# Patient Record
Sex: Male | Born: 1950 | Race: White | Hispanic: No | Marital: Married | Smoking: Former smoker
Health system: Southern US, Community
[De-identification: ages and names within clinical notes are randomized; demographics above are authoritative.]

## PROBLEM LIST (undated history)

## (undated) DIAGNOSIS — G8929 Other chronic pain: Secondary | ICD-10-CM

## (undated) DIAGNOSIS — G4730 Sleep apnea, unspecified: Secondary | ICD-10-CM

## (undated) DIAGNOSIS — G479 Sleep disorder, unspecified: Secondary | ICD-10-CM

## (undated) DIAGNOSIS — T4145XA Adverse effect of unspecified anesthetic, initial encounter: Secondary | ICD-10-CM

## (undated) DIAGNOSIS — R251 Tremor, unspecified: Secondary | ICD-10-CM

## (undated) DIAGNOSIS — K449 Diaphragmatic hernia without obstruction or gangrene: Secondary | ICD-10-CM

## (undated) DIAGNOSIS — H579 Unspecified disorder of eye and adnexa: Secondary | ICD-10-CM

## (undated) DIAGNOSIS — T8859XA Other complications of anesthesia, initial encounter: Secondary | ICD-10-CM

## (undated) DIAGNOSIS — K921 Melena: Secondary | ICD-10-CM

## (undated) DIAGNOSIS — J309 Allergic rhinitis, unspecified: Secondary | ICD-10-CM

## (undated) DIAGNOSIS — E785 Hyperlipidemia, unspecified: Secondary | ICD-10-CM

## (undated) DIAGNOSIS — K219 Gastro-esophageal reflux disease without esophagitis: Secondary | ICD-10-CM

## (undated) DIAGNOSIS — R109 Unspecified abdominal pain: Secondary | ICD-10-CM

## (undated) DIAGNOSIS — C801 Malignant (primary) neoplasm, unspecified: Secondary | ICD-10-CM

## (undated) DIAGNOSIS — R1013 Epigastric pain: Secondary | ICD-10-CM

## (undated) DIAGNOSIS — IMO0002 Reserved for concepts with insufficient information to code with codable children: Secondary | ICD-10-CM

## (undated) DIAGNOSIS — K439 Ventral hernia without obstruction or gangrene: Secondary | ICD-10-CM

## (undated) DIAGNOSIS — R0609 Other forms of dyspnea: Secondary | ICD-10-CM

## (undated) DIAGNOSIS — K5790 Diverticulosis of intestine, part unspecified, without perforation or abscess without bleeding: Secondary | ICD-10-CM

## (undated) DIAGNOSIS — M542 Cervicalgia: Secondary | ICD-10-CM

## (undated) DIAGNOSIS — F5104 Psychophysiologic insomnia: Secondary | ICD-10-CM

## (undated) DIAGNOSIS — J302 Other seasonal allergic rhinitis: Secondary | ICD-10-CM

## (undated) DIAGNOSIS — K30 Functional dyspepsia: Secondary | ICD-10-CM

## (undated) DIAGNOSIS — R197 Diarrhea, unspecified: Secondary | ICD-10-CM

## (undated) DIAGNOSIS — M549 Dorsalgia, unspecified: Secondary | ICD-10-CM

## (undated) DIAGNOSIS — R6889 Other general symptoms and signs: Secondary | ICD-10-CM

## (undated) DIAGNOSIS — R0602 Shortness of breath: Secondary | ICD-10-CM

## (undated) DIAGNOSIS — R0600 Dyspnea, unspecified: Secondary | ICD-10-CM

## (undated) DIAGNOSIS — R42 Dizziness and giddiness: Secondary | ICD-10-CM

## (undated) DIAGNOSIS — M2550 Pain in unspecified joint: Secondary | ICD-10-CM

## (undated) DIAGNOSIS — R030 Elevated blood-pressure reading, without diagnosis of hypertension: Secondary | ICD-10-CM

## (undated) DIAGNOSIS — R0789 Other chest pain: Secondary | ICD-10-CM

## (undated) DIAGNOSIS — K319 Disease of stomach and duodenum, unspecified: Secondary | ICD-10-CM

## (undated) DIAGNOSIS — G5603 Carpal tunnel syndrome, bilateral upper limbs: Secondary | ICD-10-CM

## (undated) DIAGNOSIS — H9190 Unspecified hearing loss, unspecified ear: Secondary | ICD-10-CM

## (undated) DIAGNOSIS — I10 Essential (primary) hypertension: Secondary | ICD-10-CM

## (undated) DIAGNOSIS — Z87442 Personal history of urinary calculi: Secondary | ICD-10-CM

## (undated) DIAGNOSIS — K5792 Diverticulitis of intestine, part unspecified, without perforation or abscess without bleeding: Secondary | ICD-10-CM

## (undated) HISTORY — DX: Diverticulosis of intestine, part unspecified, without perforation or abscess without bleeding: K57.90

## (undated) HISTORY — DX: Unspecified abdominal pain: R10.9

## (undated) HISTORY — DX: Carpal tunnel syndrome, bilateral upper limbs: G56.03

## (undated) HISTORY — DX: Functional dyspepsia: K30

## (undated) HISTORY — DX: Melena: K92.1

## (undated) HISTORY — DX: Other chest pain: R07.89

## (undated) HISTORY — DX: Diarrhea, unspecified: R19.7

## (undated) HISTORY — DX: Dizziness and giddiness: R42

## (undated) HISTORY — DX: Other chronic pain: G89.29

## (undated) HISTORY — PX: APPENDECTOMY: SHX54

## (undated) HISTORY — DX: Sleep apnea, unspecified: G47.30

## (undated) HISTORY — DX: Hyperlipidemia, unspecified: E78.5

## (undated) HISTORY — DX: Diaphragmatic hernia without obstruction or gangrene: K44.9

## (undated) HISTORY — DX: Dyspnea, unspecified: R06.00

## (undated) HISTORY — PX: EYE SURGERY: SHX253

## (undated) HISTORY — DX: Epigastric pain: R10.13

## (undated) HISTORY — DX: Gastro-esophageal reflux disease without esophagitis: K21.9

## (undated) HISTORY — DX: Disease of stomach and duodenum, unspecified: K31.9

## (undated) HISTORY — DX: Tremor, unspecified: R25.1

## (undated) HISTORY — PX: UPPER GI ENDOSCOPY: SHX6162

## (undated) HISTORY — DX: Allergic rhinitis, unspecified: J30.9

## (undated) HISTORY — PX: NASAL SEPTUM SURGERY: SHX37

## (undated) HISTORY — DX: Elevated blood-pressure reading, without diagnosis of hypertension: R03.0

## (undated) HISTORY — DX: Psychophysiologic insomnia: F51.04

## (undated) HISTORY — DX: Shortness of breath: R06.02

## (undated) HISTORY — DX: Dorsalgia, unspecified: M54.9

## (undated) HISTORY — DX: Other general symptoms and signs: R68.89

## (undated) HISTORY — DX: Essential (primary) hypertension: I10

## (undated) HISTORY — PX: COLONOSCOPY: SHX174

## (undated) HISTORY — DX: Sleep disorder, unspecified: G47.9

## (undated) HISTORY — DX: Pain in unspecified joint: M25.50

## (undated) HISTORY — DX: Other forms of dyspnea: R06.09

## (undated) HISTORY — DX: Cervicalgia: M54.2

---

## 1999-07-24 ENCOUNTER — Ambulatory Visit (HOSPITAL_COMMUNITY): Admission: RE | Admit: 1999-07-24 | Discharge: 1999-07-24 | Payer: Self-pay | Admitting: Pulmonary Disease

## 1999-08-16 ENCOUNTER — Encounter: Payer: Self-pay | Admitting: Pulmonary Disease

## 1999-08-16 ENCOUNTER — Encounter: Admission: RE | Admit: 1999-08-16 | Discharge: 1999-08-16 | Payer: Self-pay | Admitting: Pulmonary Disease

## 2000-10-02 ENCOUNTER — Ambulatory Visit (HOSPITAL_COMMUNITY): Admission: RE | Admit: 2000-10-02 | Discharge: 2000-10-02 | Payer: Self-pay | Admitting: *Deleted

## 2000-10-02 ENCOUNTER — Encounter: Payer: Self-pay | Admitting: *Deleted

## 2000-10-07 ENCOUNTER — Encounter: Payer: Self-pay | Admitting: *Deleted

## 2000-10-07 ENCOUNTER — Ambulatory Visit (HOSPITAL_COMMUNITY): Admission: RE | Admit: 2000-10-07 | Discharge: 2000-10-07 | Payer: Self-pay | Admitting: *Deleted

## 2001-11-24 ENCOUNTER — Encounter: Payer: Self-pay | Admitting: Family Medicine

## 2001-11-24 ENCOUNTER — Encounter: Admission: RE | Admit: 2001-11-24 | Discharge: 2001-11-24 | Payer: Self-pay | Admitting: Family Medicine

## 2004-08-26 ENCOUNTER — Encounter: Admission: RE | Admit: 2004-08-26 | Discharge: 2004-08-26 | Payer: Self-pay | Admitting: Family Medicine

## 2005-10-14 ENCOUNTER — Ambulatory Visit: Payer: Self-pay | Admitting: Gastroenterology

## 2005-10-15 ENCOUNTER — Encounter: Payer: Self-pay | Admitting: Gastroenterology

## 2005-10-15 ENCOUNTER — Ambulatory Visit: Payer: Self-pay | Admitting: Gastroenterology

## 2005-10-20 ENCOUNTER — Ambulatory Visit: Payer: Self-pay | Admitting: Gastroenterology

## 2005-11-04 ENCOUNTER — Ambulatory Visit: Payer: Self-pay | Admitting: Gastroenterology

## 2005-12-16 ENCOUNTER — Ambulatory Visit: Payer: Self-pay | Admitting: Gastroenterology

## 2005-12-18 ENCOUNTER — Ambulatory Visit (HOSPITAL_COMMUNITY): Admission: RE | Admit: 2005-12-18 | Discharge: 2005-12-18 | Payer: Self-pay | Admitting: Gastroenterology

## 2006-01-13 ENCOUNTER — Ambulatory Visit: Payer: Self-pay | Admitting: Gastroenterology

## 2006-02-17 ENCOUNTER — Ambulatory Visit: Payer: Self-pay | Admitting: Gastroenterology

## 2006-03-25 ENCOUNTER — Ambulatory Visit (HOSPITAL_BASED_OUTPATIENT_CLINIC_OR_DEPARTMENT_OTHER): Admission: RE | Admit: 2006-03-25 | Discharge: 2006-03-25 | Payer: Self-pay | Admitting: Urology

## 2006-03-25 ENCOUNTER — Encounter (INDEPENDENT_AMBULATORY_CARE_PROVIDER_SITE_OTHER): Payer: Self-pay | Admitting: Specialist

## 2007-02-09 ENCOUNTER — Ambulatory Visit: Payer: Self-pay | Admitting: Gastroenterology

## 2007-02-10 ENCOUNTER — Ambulatory Visit: Payer: Self-pay | Admitting: Gastroenterology

## 2007-03-08 ENCOUNTER — Ambulatory Visit: Payer: Self-pay | Admitting: Internal Medicine

## 2007-03-08 ENCOUNTER — Encounter: Payer: Self-pay | Admitting: Gastroenterology

## 2007-04-06 ENCOUNTER — Ambulatory Visit: Payer: Self-pay | Admitting: Gastroenterology

## 2007-05-10 DIAGNOSIS — J309 Allergic rhinitis, unspecified: Secondary | ICD-10-CM | POA: Insufficient documentation

## 2007-05-10 DIAGNOSIS — K449 Diaphragmatic hernia without obstruction or gangrene: Secondary | ICD-10-CM | POA: Insufficient documentation

## 2007-05-10 DIAGNOSIS — Z87442 Personal history of urinary calculi: Secondary | ICD-10-CM | POA: Insufficient documentation

## 2007-05-10 DIAGNOSIS — IMO0002 Reserved for concepts with insufficient information to code with codable children: Secondary | ICD-10-CM | POA: Insufficient documentation

## 2007-05-10 DIAGNOSIS — Z8711 Personal history of peptic ulcer disease: Secondary | ICD-10-CM | POA: Insufficient documentation

## 2007-05-10 DIAGNOSIS — K219 Gastro-esophageal reflux disease without esophagitis: Secondary | ICD-10-CM | POA: Insufficient documentation

## 2007-05-10 DIAGNOSIS — G4730 Sleep apnea, unspecified: Secondary | ICD-10-CM | POA: Insufficient documentation

## 2007-05-10 DIAGNOSIS — Z87448 Personal history of other diseases of urinary system: Secondary | ICD-10-CM | POA: Insufficient documentation

## 2008-02-08 ENCOUNTER — Encounter: Admission: RE | Admit: 2008-02-08 | Discharge: 2008-02-08 | Payer: Self-pay | Admitting: Neurology

## 2009-10-11 ENCOUNTER — Ambulatory Visit: Payer: Self-pay | Admitting: Diagnostic Radiology

## 2009-10-11 ENCOUNTER — Emergency Department (HOSPITAL_BASED_OUTPATIENT_CLINIC_OR_DEPARTMENT_OTHER): Admission: EM | Admit: 2009-10-11 | Discharge: 2009-10-11 | Payer: Self-pay | Admitting: Emergency Medicine

## 2010-04-14 ENCOUNTER — Encounter: Payer: Self-pay | Admitting: Family Medicine

## 2010-04-14 ENCOUNTER — Encounter: Payer: Self-pay | Admitting: Otolaryngology

## 2010-04-24 ENCOUNTER — Emergency Department (HOSPITAL_COMMUNITY)
Admission: EM | Admit: 2010-04-24 | Discharge: 2010-04-24 | Disposition: A | Discharge status: Home / Self Care | Payer: 59 | Attending: Emergency Medicine | Admitting: Emergency Medicine

## 2010-04-24 ENCOUNTER — Emergency Department (HOSPITAL_BASED_OUTPATIENT_CLINIC_OR_DEPARTMENT_OTHER): Payer: 59 | Attending: Emergency Medicine

## 2010-04-24 ENCOUNTER — Emergency Department (HOSPITAL_COMMUNITY): Payer: 59

## 2010-04-24 ENCOUNTER — Emergency Department (HOSPITAL_COMMUNITY): Payer: Self-pay

## 2010-04-24 DIAGNOSIS — R0989 Other specified symptoms and signs involving the circulatory and respiratory systems: Secondary | ICD-10-CM | POA: Insufficient documentation

## 2010-04-24 DIAGNOSIS — Z79899 Other long term (current) drug therapy: Secondary | ICD-10-CM | POA: Insufficient documentation

## 2010-04-24 DIAGNOSIS — IMO0002 Reserved for concepts with insufficient information to code with codable children: Secondary | ICD-10-CM | POA: Insufficient documentation

## 2010-04-24 DIAGNOSIS — K219 Gastro-esophageal reflux disease without esophagitis: Secondary | ICD-10-CM | POA: Insufficient documentation

## 2010-04-24 DIAGNOSIS — R059 Cough, unspecified: Secondary | ICD-10-CM

## 2010-04-24 DIAGNOSIS — M129 Arthropathy, unspecified: Secondary | ICD-10-CM | POA: Insufficient documentation

## 2010-04-24 DIAGNOSIS — G8929 Other chronic pain: Secondary | ICD-10-CM | POA: Insufficient documentation

## 2010-04-24 DIAGNOSIS — M549 Dorsalgia, unspecified: Secondary | ICD-10-CM | POA: Insufficient documentation

## 2010-04-24 DIAGNOSIS — J40 Bronchitis, not specified as acute or chronic: Secondary | ICD-10-CM | POA: Insufficient documentation

## 2010-04-24 DIAGNOSIS — J329 Chronic sinusitis, unspecified: Secondary | ICD-10-CM | POA: Insufficient documentation

## 2010-04-24 DIAGNOSIS — R05 Cough: Secondary | ICD-10-CM | POA: Insufficient documentation

## 2010-06-08 LAB — CBC
MCH: 28.8 pg (ref 26.0–34.0)
Platelets: 214 10*3/uL (ref 150–400)
RBC: 5.04 MIL/uL (ref 4.22–5.81)
WBC: 6.6 10*3/uL (ref 4.0–10.5)

## 2010-06-08 LAB — BASIC METABOLIC PANEL
Calcium: 9.2 mg/dL (ref 8.4–10.5)
Creatinine, Ser: 0.9 mg/dL (ref 0.4–1.5)
GFR calc Af Amer: >60 mL/min (ref >60)
GFR calc non Af Amer: >60 mL/min (ref >60)

## 2010-06-08 LAB — POCT CARDIAC MARKERS
CKMB, poc: 2.2 ng/mL (ref 1.0–8.0)
CKMB, poc: 2.3 ng/mL (ref 1.0–8.0)
Myoglobin, poc: 45.6 ng/mL (ref 12–200)
Myoglobin, poc: 70.1 ng/mL (ref 12–200)

## 2010-06-08 LAB — DIFFERENTIAL
Eosinophils Absolute: 0.1 10*3/uL (ref 0.0–0.7)
Lymphocytes Relative: 38 % (ref 12–46)
Lymphs Abs: 2.5 10*3/uL (ref 0.7–4.0)
Neutrophils Relative %: 55 % (ref 43–77)

## 2010-06-08 LAB — D-DIMER, QUANTITATIVE: D-Dimer, Quant: <0.22 ug/mL-FEU (ref 0.00–0.48)

## 2010-08-06 NOTE — Assessment & Plan Note (Signed)
[facility]                         GASTROENTEROLOGY OFFICE NOTE   NAME:Thielman, DELORES THELEN                      MRN:          [id number]  DATE:03/08/2007                            DOB:          Jul 20, 1950    Small bowel capsule procedure.   REFERRING PHYSICIAN:  Dr. Sheryn Bison.   INDICATIONS:  Melena. He has also had some epigastric pain.   Please see the printed report with photos for full details.   This is a completed study, some obscuring of small bowel mucosa by  bilious fluid. There were no abnormalities seen.   PLAN:  Per Dr. Jarold Motto. I did not think that there were any lesions  that would occupy greater than a third of lumen i.e. nothing like this  would have been missed.     Iva Boop, MD,FACG  Electronically Signed    CEG/MedQ  DD: 03/20/2007  DT: 03/21/2007  Job #: [id number]

## 2010-08-06 NOTE — Assessment & Plan Note (Signed)
[facility]                         GASTROENTEROLOGY OFFICE NOTE   NAME:[name], [name]                      MRN:          [id number]  DATE:04/06/2007                            DOB:          [birth year]    [name] is doing well and denies any further melena or hematochezia.  He continues with left lower quadrant intermittent pain, alternating  diarrhea and constipation consistent with irritable bowel syndrome, has  known diverticulosis coli.  We did enteral capsule study on March 08, 2007 to complete his workup.  This was interpreted by Dr. Leone Payor and  was entirely normal.   He weighs 216 pounds and blood pressure is 138/90 and pulse was 88 and  regular.  ABDOMINAL:  Entirely unremarkable except for some tenderness over the  sigmoid colon without any masses palpated.  Bowel sounds were normal.   ASSESSMENT:  1. Symptomatic diverticulosis.  2. History of chronic gastroesophageal reflux disease with added      proton pump inhibitor therapy controlling symptoms.  3. Vague history of recurrent melena of unexplained etiology.   RECOMMENDATIONS:  1. Continue high fiber diet with daily Citrucel.  2. Continue reflux regimen and PPI therapy.  3. Trial of sublingual Levsin every 6-8 hours.     Vania Rea. Jarold Motto, MD, Caleen Essex, FAGA  Electronically Signed    DRP/MedQ  DD: 04/06/2007  DT: 04/06/2007  Job #: [id number]

## 2010-08-06 NOTE — Assessment & Plan Note (Signed)
[facility]                         GASTROENTEROLOGY OFFICE NOTE   NAME:[name], LATREL SZYMCZAK                      MRN:          [id number]  DATE:02/09/2007                            DOB:          07/27/1950    Mr. Mans now complains of some vague epigastric discomfort, with a  history of melanotic stools several weeks ago.  He apparently saw his  primary care physician, who told him that his hemoglobin was 11 and  suggested a gastroenterology followup.  The patient has chronic left  lower quadrant pain and constipation, with a thorough extensive GI  workup.  His last colonoscopy in July 2007 did show extensive colon  diverticulosis and no evidence of diverticulitis.   The patient is on multiple medications, listed and reviewed in his  chart.  Does include daily Prevacid.  He denies abuse of aspirin or  NSAIDs.   Previous endoscopic examination was last performed in July 2007, and was  fairly unremarkable; with a negative CLO biopsy.   He is a healthy-appearing white male in no distress.  He weighs 218  pounds; blood pressure 142/90, pulse 64 and regular.  I could not  appreciate stigmata of chronic liver disease.  His chest was clear and  he had a regular rhythm without murmur, gallop or rub.  There was  hepatosplenomegaly, abdominal masses or tenderness.  Bowel sounds were  normal.  Inspection of the rectum was unremarkable, as was rectal  examination.  There was normal colored stool that was guaiac negative.   ASSESSMENT:  This is somewhat confusing picture in view of his previous  negative endoscopy.  He certainly sounds like he has had an upper GI  source of his bleeding, and we will proceed with endoscopic examination  and alter his therapy depending on what results we uncover.  I see no  evidence of active bleeding at this time.  Of note, the patient also had  high density air contrast barium enema because of his left lower  quadrant pain.   This was done in September 2007 and was normal, except  for diverticulosis.     Vania Rea. Jarold Motto, MD, Caleen Essex, FAGA  Electronically Signed    DRP/MedQ  DD: 02/09/2007  DT: 02/09/2007  Job #: [id number]

## 2010-08-09 NOTE — Assessment & Plan Note (Signed)
[facility]                           GASTROENTEROLOGY OFFICE NOTE   NAME:[name], [name]                      MRN:          [id number]  DATE:01/13/2006                            DOB:          24-Aug-1950    [name] is being evaluated by Dr. Sherron Monday for prostatitis and is  currently on Cipro twice a day for a month.  He continues with constant left  lower quadrant discomfort with the mild diarrhea but no fever or chills.  His blood work the last time was normal including CBC, and I sent him for a  air contrast barium enema that was normal except for some diverticulosis.  His previous colonoscopy on 10/15/2005 likewise was normal, including  intubation of his terminal ileum and ileal biopsies.   IBD serology has been negative.  Both ileal and small-bowel biopsies have  been normal.   He previously had blood in his stool, but rectal exam today is unremarkable  and stool is guaiac-negative.  There is tenderness over his left lower  quadrant area, however,  Vital signs were all normal and he has gained 5  pounds in weight.   ASSESSMENT:  [name] appeared to have symptomatic diverticulosis.  He  previously had good response in terms of his multiple symptoms to Zyfaxin  (nonabsorbable antibiotic) therapy which was done empirically.  We will see  how he does at this time on Cipro for the next month.   RECOMMENDATIONS:  1. High fiber diet and daily Benefiber.  2. Trial of Pamine forte 5 mg after breakfast and twice a day as      tolerated.  3. GI followup in 1 months time.  4. Continue other medications as per Dr. Foy Guadalajara.       Vania Rea. Jarold Motto, MD, Clementeen Graham, [place]      DRP/MedQ  DD:  01/13/2006  DT:  01/14/2006  Job #:  [id number]   cc:   Elana Alm. Nicholos Johns, M.D.  Martina Sinner, MD

## 2010-08-09 NOTE — Assessment & Plan Note (Signed)
[facility]                           GASTROENTEROLOGY OFFICE NOTE   NAME:[name], [name]                      MRN:          [id number]  DATE:02/17/2006                            DOB:          Sep 08, 1950    [name] continues with vague discomfort in his suprapubic area, perhaps  related to his prostate.  He apparently is going to see Dr. Laverle Patter, in  urology, for prostate biopsy because of a rise in PSA level.  He just  completed a month of Cipro for suspected chronic prostatitis without  improvement in his lower abdominal pain.   He continues to have diverticulosis problems of mostly chronic constipation  with gas and bloating.  He had no response whatsoever to  Robinul Forte trials.  He denies nausea, vomiting, or upper GI complaints.  His last colonoscopy was negative otherwise on October 15, 2005.   PHYSICAL EXAMINATION:  VITAL SIGNS:  He weighs 209 pounds which is his  normal weight, and blood pressure is 140/94.  Pulse was 80 and regular.  ABDOMEN:  Entirely benign without organomegaly, masses, or significant  tenderness.   ASSESSMENT/RECOMMENDATIONS:  [name] has symptomatic diverticulosis with  primary constipation.  I have decided to treat him with Amitiza 24 mcg twice  a day with meals and see how he responds.  I think most of his lower  abdominal pain is related to his prostate problems.  I will ask Dr. Laverle Patter  to send [place] a copy of his evaluation.     Vania Rea. Jarold Motto, MD, Caleen Essex, FAGA  Electronically Signed    DRP/MedQ  DD: 02/17/2006  DT: 02/17/2006  Job #: [id number]

## 2010-08-09 NOTE — Assessment & Plan Note (Signed)
 [facility]                           GASTROENTEROLOGY OFFICE NOTE   NAME:Dubray, VONDELL BABERS                      MRN:          [id number]  DATE:12/16/2005                            DOB:          Nov 28, 1950    Mr. Goens had some maroon stool last week and lower abdominal discomfort.  This seems to have resolved at this time.  His vital signs are all stable,  but he does have tenderness in the left lower quadrant area to deep  palpation without a definite mass.  Stool was normal color, but was trace to  +1 guaiac positive.   He had a negative colonoscopy this past July 25, also negative endoscopy at  that time.  I am concerned about his symptomatology and I wonder if he does  not have perhaps resolving ischemic colitis versus a missed colon lesion.   RECOMMENDATIONS:  1. Repeat CBC and basic laboratory parameters.  2. Outpatient air contrast barium enema.  3. Continue other medications.  Just review his chart.                                   Vania Rea. Jarold Motto, MD, Clementeen Graham, [place]   DRP/MedQ  DD:  12/16/2005  DT:  12/18/2005  Job #:  [id number]

## 2010-08-09 NOTE — Procedures (Signed)
[facility]                                 ULTRASOUND STUDY   NAME:[name], [name]                      MRN:          [id number]  DATE:10/20/2005                            DOB:          [birth year]    ACCESSION NUMBER:  [id number]   READING PHYSICIAN:  Vania Rea. Jarold Motto, MD, Clementeen Graham, FACP   PROCEDURE:  Multiplanar abdominal ultrasound imaging was performed in the  upright, supine, right and left lateral decubitus positions.   RESULTS:  Abdominal aorta normal, 2.2 cm.  The IVC is patent.   The pancreas appears normal throughout the head, body and tail without  evidence of ductal dilatation, pancreatic masses, or peripancreatic  inflammation.   Gallbladder is normal, well distended, thin walled, with no pericholecystic  fluid or intraluminal echogenic foci to suggest gallstone disease.   The common bile duct is normal and measures 4.5 mm in maximal diameter  without evidence of intraluminal foci.   The liver appears normal without evidence of parenchymal lesion, ductal  dilatation or vascular abnormality.   Kidneys are normal in appearance, right 10.8, left 10.7 cm.   Spleen is normal in size, measuring 10.1 cm without parenchymal lesion.   ASSESSMENT:  This was a normal upper abdominal ultrasound of good quality.  There was no evidence of cholelithiasis or biliary ductal dilatation.  The  pancreas, liver, and kidneys are well-imaged and appear normal.                                   Vania Rea. Jarold Motto, MD, Clementeen Graham, [place]   DRP/MedQ  DD:  10/21/2005  DT:  10/21/2005  Job #:  [id number]

## 2010-08-09 NOTE — Assessment & Plan Note (Signed)
[facility]                           GASTROENTEROLOGY OFFICE NOTE   NAME:[name], [name]                      MRN:          [id number]  DATE:  10/14/2005                              DOB:      Apr 15, 1950    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 Foy Guzman for evaluation of recurrent burning substernal pain  radiating through his throat, associated mouth sores, hoarseness, and  associated dyspepsia, indigestion, and a burning sensation throughout his  entire GI tract into his rectum. He apparently has had recurrent mouth sores  and has been treated with antibiotics and antifungal medications without  improvement. He has chronic constipation and does use laxatives but has had  no rectal bleeding.  He has had associated anorexia and a 30 pound weight  loss over the last year.   I previously saw this patient and he had peptic ulcer disease at the time of  endoscopy in 1989. H. pylori exam at that time was unremarkable. He has been  on some type of proton pump inhibitor ever since, most recently Prevacid 30  mg a day.  He has had a low-grade fever but denies night sweats, skin  rashes, but does have arthralgias in multiple joints including his knees. He  has no specific food intolerances. He has been seen by Dr. Foy Guzman and is  referred for further evaluation at this time with possible endoscopy.  It is  Dr. Pablo Guzman opinion that he probably has irritable bowel syndrome.   PAST MEDICAL HISTORY:  Otherwise is remarkable for degenerative arthritis  and degenerative disk disease in his back and neck. He passed a kidney stone  some 15 years ago.  He carries a diagnosis of sleep apnea, but is not on a C-Pap machine. He  had an appendectomy 40 years ago.   MEDICATIONS:  1.  Allegra 180 mg daily.  2.  Trazodone 150 mg h.s.  3.  Celebrex 200 mg daily.  4.  Ambien 12.5 mg h.s.  5.  Neurontin 400 mg t.i.d.  6.   Cyclobenzaprine 10 mg h.s.  7.  Prevacid 30 mg daily.   ALLERGIES:  He denies drug allergies.   FAMILY HISTORY:  Noncontributory.   SOCIAL HISTORY:  The patient is married and lives with his wife. He has some  college education. He does not smoke and uses beer socially.   REVIEW OF SYSTEMS:  The patient complains of severe fatigue, night sweats,  back pain, diffuse pruritus, insomnia, bleeding skin rashes, joint pains,  etc. He denies any specific cardiovascular, pulmonary, genitourinary,  neurologic, psychiatric, or endocrine problems otherwise.   PHYSICAL EXAMINATION:  GENERAL:  He is a healthy appearing white male  appearing his stated age, in no distress.  VITAL SIGNS:  He is 6 feet tall and weighs 200 pounds. Blood pressure is  114/74 and pulse was 84 and regular. I could not appreciate stigmata of  chronic liver disease. There was no thyromegaly or lymphadenopathy noted.  CHEST:  Clear to percussion and auscultation, and there were no murmur,  gallops or rubs noted.  CARDIOVASCULAR:  He was in a regular rhythm.  ABDOMEN:  I could not appreciate hepatosplenomegaly, abdominal masses or  tenderness. Bowel sounds were normal.  EXTREMITIES:  Unremarkable.  NEURO:  Mental status was normal.  RECTAL:  Unremarkable rectal exam and stool was Guaiac negative.  OROPHARYNX:  Revealed no aphthous erosions or ulcerations despite his  insistence on such.   ASSESSMENT:  I am not sure exactly what is wrong with [name]. My gut  instinct is that he probably has functional bowel problems. I think however  we need to exclude underlying inflammatory bowel disease, celiac disease, or  a combination of upper and lower GI problems mostly manifested by GI  motility disturbances.  I am somewhat concerned that he has had a 30 pound  weight loss with associated anorexia, and I think he deserves full  diagnostic evaluation.   RECOMMENDATIONS:  1.  Check CBC, metabolic profile, thyroid panel, sed  rate, IBD serology      markers, and sprue antibodies.  2.  Outpatient endoscopy and colonoscopy.  3.  Consider upper abdominal ultrasound exam.  4.  Further work up once above completed.  5.  Continue other medications as per Dr. Foy Guzman until work up completed.                                   Vania Rea. Jarold Motto, MD, Clementeen Graham, [place]   DRP/MedQ  DD:  10/14/2005  DT:  10/14/2005  Job #:  [id number]   cc:   Warren Maduro L. Warren Guadalajara, MD

## 2010-08-09 NOTE — Op Note (Signed)
NAME:  [name], [name] NO.:  [id number]   MEDICAL RECORD NO.:  [id number]          PATIENT TYPE:  AMB   LOCATION:  [facility]                         FACILITY:  [facility]   PHYSICIAN:  Heloise Purpura, MD      DATE OF BIRTH:  June 21, 1950   DATE OF PROCEDURE:  03/25/2006  DATE OF DISCHARGE:                               OPERATIVE REPORT   PREOPERATIVE DIAGNOSIS:  Elevated PSA.   POSTOPERATIVE DIAGNOSIS:  Elevated PSA.   PROCEDURES:  1. Transrectal ultrasound of the prostate.  2. Prostate needle biopsy.   SURGEON:  Dr. Heloise Purpura.   ANESTHESIA:  MAC with local periprostatic block.   SPECIMENS:  Prostate biopsies from the left apex, left mid, left base,  right apex, right mid, and right base.   INDICATION:  [name] is a 60 year old gentleman with an elevated PSA  of 3.78 and a low free percentage of 6%.  He did have an increased PSA  velocity within the past year.  Despite a normal digital rectal exam,  after our discussion, we elected to proceed with a prostate biopsy.  The  patient has a very low tolerance to pain and, therefore, it was decided  to perform his procedure under anesthesia.  Potential risks and benefits  were discussed with the patient and he consented.   DESCRIPTION OF PROCEDURE:  The patient was taken to the operating room  and a MAC anesthetic was administered.  He had been prescribed pre  procedure antibiotics beginning the day before his procedure.  He also  has been administered a pre procedure enema.  He was placed in the left  lateral decubitus position and a periprostatic nerve block with 2%  lidocaine (10 mL) was performed.  The prostate was then imaged and  demonstrated a hypoechoic area in the right mid lateral portion of the  prostate.  No other abnormalities were noted.  The prostate volume was  measured at 21.5 mL.  Twelve systematic biopsies were then performed  from the prostate at the left base, left mid, left apex, right base,  right mid, and right apex of the prostate.  The patient tolerated the  procedure well and without complications.  A digital rectal exam was  performed and there was no evidence of a rectal hematoma at the end of  the procedure.  He was therefore able to be awakened and transferred to  the recovery unit in satisfactory condition.           ______________________________  Heloise Purpura, MD  Electronically Signed     LB/MEDQ  D:  03/25/2006  T:  03/25/2006  Job:  [id number]

## 2011-08-11 ENCOUNTER — Encounter (HOSPITAL_BASED_OUTPATIENT_CLINIC_OR_DEPARTMENT_OTHER): Payer: Self-pay

## 2011-08-11 ENCOUNTER — Emergency Department (HOSPITAL_BASED_OUTPATIENT_CLINIC_OR_DEPARTMENT_OTHER): Payer: 59

## 2011-08-11 ENCOUNTER — Other Ambulatory Visit: Payer: Self-pay

## 2011-08-11 ENCOUNTER — Emergency Department (HOSPITAL_BASED_OUTPATIENT_CLINIC_OR_DEPARTMENT_OTHER)
Admission: EM | Admit: 2011-08-11 | Discharge: 2011-08-11 | Disposition: A | Discharge status: Home / Self Care | Payer: 59 | Attending: Emergency Medicine | Admitting: Emergency Medicine

## 2011-08-11 DIAGNOSIS — R091 Pleurisy: Secondary | ICD-10-CM | POA: Insufficient documentation

## 2011-08-11 DIAGNOSIS — G8929 Other chronic pain: Secondary | ICD-10-CM | POA: Insufficient documentation

## 2011-08-11 DIAGNOSIS — J45909 Unspecified asthma, uncomplicated: Secondary | ICD-10-CM | POA: Insufficient documentation

## 2011-08-11 DIAGNOSIS — R1032 Left lower quadrant pain: Secondary | ICD-10-CM | POA: Insufficient documentation

## 2011-08-11 DIAGNOSIS — R05 Cough: Secondary | ICD-10-CM | POA: Insufficient documentation

## 2011-08-11 DIAGNOSIS — J209 Acute bronchitis, unspecified: Secondary | ICD-10-CM | POA: Insufficient documentation

## 2011-08-11 DIAGNOSIS — R059 Cough, unspecified: Secondary | ICD-10-CM | POA: Insufficient documentation

## 2011-08-11 DIAGNOSIS — R079 Chest pain, unspecified: Secondary | ICD-10-CM | POA: Insufficient documentation

## 2011-08-11 DIAGNOSIS — R0602 Shortness of breath: Secondary | ICD-10-CM | POA: Insufficient documentation

## 2011-08-11 HISTORY — DX: Diverticulitis of intestine, part unspecified, without perforation or abscess without bleeding: K57.92

## 2011-08-11 HISTORY — DX: Reserved for concepts with insufficient information to code with codable children: IMO0002

## 2011-08-11 LAB — BASIC METABOLIC PANEL
CO2: 27 mEq/L (ref 19–32)
Calcium: 9.3 mg/dL (ref 8.4–10.5)
Sodium: 139 mEq/L (ref 135–145)

## 2011-08-11 LAB — DIFFERENTIAL
Basophils Absolute: 0 10*3/uL (ref 0.0–0.1)
Lymphocytes Relative: 38 % (ref 12–46)
Monocytes Absolute: 0.6 10*3/uL (ref 0.1–1.0)
Neutro Abs: 4.2 10*3/uL (ref 1.7–7.7)
Neutrophils Relative %: 53 % (ref 43–77)

## 2011-08-11 LAB — D-DIMER, QUANTITATIVE: D-Dimer, Quant: 0.28 ug/mL-FEU (ref 0.00–0.48)

## 2011-08-11 LAB — HEPATIC FUNCTION PANEL
AST: 24 U/L (ref 0–37)
Bilirubin, Direct: <0.1 mg/dL (ref 0.0–0.3)

## 2011-08-11 LAB — LIPASE, BLOOD: Lipase: 64 U/L — ABNORMAL HIGH (ref 11–59)

## 2011-08-11 LAB — PRO B NATRIURETIC PEPTIDE: Pro B Natriuretic peptide (BNP): 29.3 pg/mL (ref 0–125)

## 2011-08-11 LAB — CBC
MCH: 29.8 pg (ref 26.0–34.0)
MCV: 86.3 fL (ref 78.0–100.0)
Platelets: 235 10*3/uL (ref 150–400)
RBC: 4.59 MIL/uL (ref 4.22–5.81)
RDW: 12.9 % (ref 11.5–15.5)
WBC: 7.8 10*3/uL (ref 4.0–10.5)

## 2011-08-11 LAB — TROPONIN I: Troponin I: <0.3 ng/mL (ref <0.30)

## 2011-08-11 MED ORDER — ALBUTEROL SULFATE HFA 108 (90 BASE) MCG/ACT IN AERS: 2.0000 | INHALATION_SPRAY | RESPIRATORY_TRACT | Status: DC | PRN

## 2011-08-11 MED ORDER — PREDNISONE 50 MG PO TABS
60.0000 mg | ORAL_TABLET | Freq: Once | ORAL | Status: AC
  Administered 2011-08-11: 60 mg via ORAL
  Filled 2011-08-11: qty 1

## 2011-08-11 MED ORDER — IPRATROPIUM BROMIDE 0.02 % IN SOLN
0.5000 mg | Freq: Once | RESPIRATORY_TRACT | Status: AC
  Administered 2011-08-11: 0.5 mg via RESPIRATORY_TRACT
  Filled 2011-08-11: qty 2.5

## 2011-08-11 MED ORDER — PREDNISONE 20 MG PO TABS: ORAL_TABLET | ORAL | Status: AC

## 2011-08-11 MED ORDER — ALBUTEROL SULFATE (5 MG/ML) 0.5% IN NEBU
5.0000 mg | INHALATION_SOLUTION | Freq: Once | RESPIRATORY_TRACT | Status: AC
  Administered 2011-08-11: 5 mg via RESPIRATORY_TRACT
  Filled 2011-08-11: qty 1

## 2011-08-11 NOTE — ED Provider Notes (Signed)
History     CSN: [id number]  Arrival date & time 08/11/11  1043   First MD Initiated Contact with Patient 08/11/11 1058      Chief Complaint  Patient presents with  . Chest Pain    (Consider location/radiation/quality/duration/timing/severity/associated sxs/prior treatment) HPI This 61 year old male has 2 weeks of cough with sharp stabbing pleuritic anterior well localized lower sternal chest type pain nonradiating present 24 hours a day worse with cough, deep breathing, and torso position changes. It is somewhat pleuritic but nonexertional. He is a cough with some yellowish sputum production as well. He has had no fever. He has no confusion or dizziness. He has no abdominal pain today but he has had chronic abdominal pain in the past in the left lower quadrant which is now bothering him recently. He has chronic stable neck and back pain in his low back area which are unchanged today. There is no treatment prior to arrival. He has a history of asthma in the past and has used inhalers in the past but has not used it recently. He has had mild shortness of breath. Past Medical History  Diagnosis Date  . Asthma   . Degenerative disk disease   . Diverticulitis   . Gastric ulcer     Past Surgical History  Procedure Date  . Appendectomy   . Nasal septum surgery     No family history on file.  History  Substance Use Topics  . Smoking status: Never Smoker   . Smokeless tobacco: Never Used  . Alcohol Use: 1.2 oz/week    1 Glasses of wine, 1 Cans of beer per week     daily      Review of Systems  Constitutional: Negative for fever.       10 Systems reviewed and are negative for acute change except as noted in the HPI.  HENT: Negative for congestion.   Eyes: Negative for discharge and redness.  Respiratory: Positive for cough and shortness of breath.   Cardiovascular: Positive for chest pain.  Gastrointestinal: Negative for vomiting and abdominal pain.  Musculoskeletal:  Negative for back pain.  Skin: Negative for rash.  Neurological: Negative for syncope, numbness and headaches.  Psychiatric/Behavioral:       No behavior change.    Allergies  Review of patient's allergies indicates no known allergies.  Home Medications   Current Outpatient Rx  Name Route Sig Dispense Refill  . CELECOXIB 200 MG PO CAPS Oral Take 200 mg by mouth daily.    Marland Kitchen VITAMIN D 1000 UNITS PO TABS Oral Take 1,000 Units by mouth daily.    . CYCLOBENZAPRINE HCL 10 MG PO TABS Oral Take 10 mg by mouth 3 (three) times daily as needed. For back pain    . OMEGA-3 FATTY ACIDS 1000 MG PO CAPS Oral Take 2 g by mouth daily.    Marland Kitchen GABAPENTIN 400 MG PO CAPS Oral Take 800 mg by mouth 2 (two) times daily.    Marland Kitchen GLUCOSAMINE-CHONDROITIN 500-400 MG PO TABS Oral Take 1 tablet by mouth daily.    Marland Kitchen HYDROCODONE-ACETAMINOPHEN 5-500 MG PO TABS Oral Take 1 tablet by mouth every 8 (eight) hours as needed. For back pain    . LORATADINE 10 MG PO TABS Oral Take 10 mg by mouth daily.    . MULTIVITAMINS PO TABS Oral Take 1 tablet by mouth daily.    Marland Kitchen PANTOPRAZOLE SODIUM 40 MG PO TBEC Oral Take 40 mg by mouth daily.    Marland Kitchen ALIGN  4 MG PO CAPS Oral Take 1 capsule by mouth daily.     Marland Kitchen TEMAZEPAM 15 MG PO CAPS Oral Take 30 mg by mouth at bedtime as needed. For sleep    . TRAZODONE HCL 150 MG PO TABS Oral Take 150 mg by mouth at bedtime. For sleep    . ALBUTEROL SULFATE HFA 108 (90 BASE) MCG/ACT IN AERS Inhalation Inhale 2 puffs into the lungs every 2 (two) hours as needed for wheezing or shortness of breath (cough). 1 Inhaler 0  . PREDNISONE 20 MG PO TABS  2 tabs po daily x 4 days 8 tablet 0    BP 149/87  Pulse 74  Temp(Src) 98.2 F (36.8 C) (Oral)  Resp 18  SpO2 97%  Physical Exam  Nursing note and vitals reviewed. Constitutional:       Awake, alert, nontoxic appearance.  HENT:  Head: Atraumatic.  Eyes: Right eye exhibits no discharge. Left eye exhibits no discharge.  Neck: Neck supple.  Cardiovascular:  Normal rate and regular rhythm.   No murmur heard. Pulmonary/Chest: Effort normal. No respiratory distress. He has wheezes. He has no rales. He exhibits tenderness.       Reproducible lower anterior sternal tenderness  Abdominal: Soft. There is no tenderness. There is no rebound.  Musculoskeletal: He exhibits no tenderness.       Baseline ROM, no obvious new focal weakness.  Neurological:       Mental status and motor strength appears baseline for patient and situation.  Skin: No rash noted.  Psychiatric: He has a normal mood and affect.    ED Course  Procedures (including critical care time) ECG: Normal sinus rhythm, ventricular rate 67, normal axis, normal intervals, no acute ischemic changes noted, no significant change compared with July 2011, impression normal ECG Labs Reviewed  BASIC METABOLIC PANEL - Abnormal; Notable for the following:    Glucose, Bld 102 (*)    All other components within normal limits  LIPASE, BLOOD - Abnormal; Notable for the following:    Lipase 64 (*)    All other components within normal limits  CBC  PRO B NATRIURETIC PEPTIDE  TROPONIN I  D-DIMER, QUANTITATIVE  HEPATIC FUNCTION PANEL  DIFFERENTIAL   No results found.   1. Chest pain   2. Bronchitis with bronchospasm   3. Pleurisy       MDM  Pt stable in ED with no significant deterioration in condition.Patient / Family / Caregiver informed of clinical course, understand medical decision-making process, and agree with plan.I doubt any other EMC precluding discharge at this time including, but not necessarily limited to the following:PE, ACS.        Hurman Horn, MD 08/18/11 [id number]

## 2011-08-11 NOTE — ED Notes (Signed)
Pt returned from radiology.

## 2011-08-11 NOTE — Discharge Instructions (Signed)
Acute Bronchitis You have acute bronchitis. This means you have a chest cold. The airways in your lungs are red and sore (inflamed). Acute means it is sudden onset.  CAUSES Bronchitis is most often caused by the same virus that causes a cold. SYMPTOMS   Body aches.   Chest congestion.   Chills.   Cough.   Fever.   Shortness of breath.   Sore throat.  TREATMENT  Acute bronchitis is usually treated with rest, fluids, and medicines for relief of fever or cough. Most symptoms should go away after a few days or a week. Increased fluids may help thin your secretions and will prevent dehydration. Your caregiver may give you an inhaler to improve your symptoms. The inhaler reduces shortness of breath and helps control cough. You can take over-the-counter pain relievers or cough medicine to decrease coughing, pain, or fever. A cool-air vaporizer may help thin bronchial secretions and make it easier to clear your chest. Antibiotics are usually not needed but can be prescribed if you smoke, are seriously ill, have chronic lung problems, are elderly, or you are at higher risk for developing complications.Allergies and asthma can make bronchitis worse. Repeated episodes of bronchitis may cause longstanding lung problems. Avoid smoking and secondhand smoke.Exposure to cigarette smoke or irritating chemicals will make bronchitis worse. If you are a cigarette smoker, consider using nicotine gum or skin patches to help control withdrawal symptoms. Quitting smoking will help your lungs heal faster. Recovery from bronchitis is often slow, but you should start feeling better after 2 to 3 days. Cough from bronchitis frequently lasts for 3 to 4 weeks. To prevent another bout of acute bronchitis:  Quit smoking.   Wash your hands frequently to get rid of viruses or use a hand sanitizer.   Avoid other people with cold or virus symptoms.   Try not to touch your hands to your mouth, nose, or eyes.  SEEK  IMMEDIATE MEDICAL CARE IF:  You develop increased fever, chills, or chest pain.   You have severe shortness of breath or bloody sputum.   You develop dehydration, fainting, repeated vomiting, or a severe headache.   You have no improvement after 1 week of treatment or you get worse.  MAKE SURE YOU:   Understand these instructions.   Will watch your condition.   Will get help right away if you are not doing well or get worse.  Document Released: 04/17/2004 Document Revised: 02/27/2011 Document Reviewed: 07/03/2010 [facility] Patient Information 2012 [place], [place].  Your caregiver has diagnosed you as having chest pain that is not specific for one problem, but does not require admission.  You are at low risk for an acute heart condition or other serious illness. Chest pain comes from many different causes.  SEEK IMMEDIATE MEDICAL ATTENTION IF: You have severe chest pain, especially if the pain is crushing or pressure-like and spreads to the arms, back, neck, or jaw, or if you have sweating, nausea (feeling sick to your stomach), or shortness of breath. THIS IS AN EMERGENCY. Don't wait to see if the pain will go away. Get medical help at once. Call 911 or 0 (operator). DO NOT drive yourself to the hospital.  Your chest pain gets worse and does not go away with rest.  You have an attack of chest pain lasting longer than usual, despite rest and treatment with the medications your caregiver has prescribed.  You wake from sleep with chest pain or shortness of breath.  You feel dizzy or  faint.  You have chest pain not typical of your usual pain for which you originally saw your caregiver.  RETURN IMMEDIATELY IF you develop shortness of breath, confusion or altered mental status, a new rash, become dizzy, faint, or poorly responsive, or are unable to be cared for at home.

## 2011-08-11 NOTE — ED Notes (Signed)
Pt reports intermittent sharp chest pain radiating to back x 2 weeks.

## 2011-08-11 NOTE — ED Notes (Signed)
Patient transported to X-ray 

## 2011-11-14 ENCOUNTER — Other Ambulatory Visit: Payer: Self-pay | Admitting: Dermatology

## 2012-02-17 ENCOUNTER — Other Ambulatory Visit: Payer: Self-pay | Admitting: Dermatology

## 2012-05-14 ENCOUNTER — Other Ambulatory Visit (HOSPITAL_BASED_OUTPATIENT_CLINIC_OR_DEPARTMENT_OTHER): Payer: Self-pay | Admitting: Family Medicine

## 2012-05-14 DIAGNOSIS — M5412 Radiculopathy, cervical region: Secondary | ICD-10-CM

## 2012-05-15 ENCOUNTER — Ambulatory Visit (HOSPITAL_BASED_OUTPATIENT_CLINIC_OR_DEPARTMENT_OTHER)
Admission: RE | Admit: 2012-05-15 | Discharge: 2012-05-15 | Disposition: A | Discharge status: Home / Self Care | Payer: 59 | Source: Ambulatory Visit | Attending: Family Medicine | Admitting: Family Medicine

## 2012-05-15 ENCOUNTER — Other Ambulatory Visit (HOSPITAL_BASED_OUTPATIENT_CLINIC_OR_DEPARTMENT_OTHER): Payer: Self-pay | Admitting: Family Medicine

## 2012-05-15 DIAGNOSIS — M5412 Radiculopathy, cervical region: Secondary | ICD-10-CM

## 2012-05-15 DIAGNOSIS — IMO0002 Reserved for concepts with insufficient information to code with codable children: Secondary | ICD-10-CM

## 2012-05-15 DIAGNOSIS — M47812 Spondylosis without myelopathy or radiculopathy, cervical region: Secondary | ICD-10-CM | POA: Insufficient documentation

## 2012-06-17 ENCOUNTER — Encounter: Payer: Self-pay | Admitting: Neurology

## 2012-06-22 HISTORY — PX: CARPAL TUNNEL RELEASE: SHX101

## 2013-03-02 DIAGNOSIS — IMO0001 Reserved for inherently not codable concepts without codable children: Secondary | ICD-10-CM

## 2013-03-02 HISTORY — DX: Reserved for inherently not codable concepts without codable children: IMO0001

## 2013-03-14 ENCOUNTER — Encounter: Payer: Self-pay | Admitting: Cardiology

## 2013-03-16 ENCOUNTER — Encounter: Payer: Self-pay | Admitting: Cardiology

## 2013-03-16 ENCOUNTER — Ambulatory Visit (INDEPENDENT_AMBULATORY_CARE_PROVIDER_SITE_OTHER): Payer: 59 | Admitting: Cardiology

## 2013-03-16 VITALS — BP 128/80 | HR 76 | Ht 70.5 in | Wt 230.0 lb

## 2013-03-16 DIAGNOSIS — R0609 Other forms of dyspnea: Secondary | ICD-10-CM

## 2013-03-16 DIAGNOSIS — R0989 Other specified symptoms and signs involving the circulatory and respiratory systems: Secondary | ICD-10-CM

## 2013-03-16 DIAGNOSIS — K219 Gastro-esophageal reflux disease without esophagitis: Secondary | ICD-10-CM

## 2013-03-16 DIAGNOSIS — Z8249 Family history of ischemic heart disease and other diseases of the circulatory system: Secondary | ICD-10-CM

## 2013-03-16 DIAGNOSIS — R0600 Dyspnea, unspecified: Secondary | ICD-10-CM

## 2013-03-16 DIAGNOSIS — R079 Chest pain, unspecified: Secondary | ICD-10-CM

## 2013-03-16 NOTE — Progress Notes (Signed)
[address] [place], [place]  [address] Phone: [phone] Fax:  [phone]  Date:  03/16/2013   ID:  [name], DOB 1951/02/11, MRN [id number]  PCP:  No primary provider on file.   History of Present Illness: [name] is a 62 y.o. male Here for the evaluation of midsternal chest discomfort/pressure with exertional dyspnea. Has a family history of coronary artery disease. His blood pressure has been elevated for approximately 2 years and antihypertensive medications were recently started. His brother urged him to come to the cardiologist for further evaluation. He has had midsternal chest pressure which is thought was secondary to hiatal hernia or peptic ulcer disease. No radiation of symptoms. No diaphoresis. He may have had occasional dyspnea or dizziness when standing or exerting himself. SOB has worsened over the past 2 years. He is a former smoker several years ago. Previous EKG demonstrated nonspecific T-wave abnormality in the inferior leads. Creatinine 0.84, TSH 1.3, normal.  Father died at age 47 from MI. Mother died of Alzheimer's.  Used to work in [employer] but had an accident, disability secondary to back pain.  Wt Readings from Last 3 Encounters:  03/16/13 230 lb (104.327 kg)     Past Medical History  Diagnosis Date  . Asthma   . Degenerative disk disease   . Diverticulitis   . Gastric ulcer   . Elevated blood pressure 03/02/13    (x) 2 years. Referral to cardiology  . SOB (shortness of breath)   . Chronic back pain     On celebrex  . Chronic neck pain     On celebrex  . Midsternal chest pain     Has had for a while, which he contributed to hiatal hernia and peptic disease. No radiation, not sure about duration, not associated with diaphoresis.  . Dizziness     occational  . DOE (dyspnea on exertion)     occasional. Now can also feel when he is at rest. Former smoker.  . Peptic disease   . Hiatal hernia   . Chest  discomfort     Could be related to hiatal hernia but because of Hx of HTN, HLD and + FHX, an exercise stress test & referral to cardiology will be arranged.  . Esophageal reflux   . Benign essential hypertension     Low salt diet. Systolic goal <140, diastolic goal <90.  Marland Kitchen Hyperlipidemia     Last FLP in 2012 slightly elevated. LDL goal <100.  Marland Kitchen Allergic rhinitis, cause unspecified   . Cervicalgia   . Pain in joint, site unspecified   . Sleep disturbance, unspecified   . Chronic insomnia   . Abdominal pain   . Dyspepsia   . Diarrhea   . Blood in stool   . Sleep apnea syndrome   . Tremor     Per Dr. Sandria Manly  . ENT complaint     Per Dr. Ezzard Standing  . Diverticulosis   . Carpal tunnel syndrome, bilateral     Past Surgical History  Procedure Laterality Date  . Appendectomy    . Nasal septum surgery      Current Outpatient Prescriptions  Medication Sig Dispense Refill  . albuterol (PROVENTIL HFA;VENTOLIN HFA) 108 (90 BASE) MCG/ACT inhaler Inhale 2 puffs into the lungs every 2 (two) hours as needed for wheezing or shortness of breath (cough).  1 Inhaler  0  . celecoxib (CELEBREX) 200 MG capsule Take 200 mg  by mouth daily.      . cholecalciferol (VITAMIN D) 1000 UNITS tablet Take 1,000 Units by mouth daily.      . cyclobenzaprine (FLEXERIL) 10 MG tablet Take 5-10 mg by mouth 3 (three) times daily as needed. For back pain      . dexlansoprazole (DEXILANT) 60 MG capsule Take 60 mg by mouth daily.      . diclofenac sodium (VOLTAREN) 1 % GEL Apply 2 g topically 4 (four) times daily as needed.      . fish oil-omega-3 fatty acids 1000 MG capsule Take 1 g by mouth daily.       . fluticasone (VERAMYST) 27.5 MCG/SPRAY nasal spray Place 1-2 sprays into the nose daily.      Marland Kitchen gabapentin (NEURONTIN) 400 MG capsule Take 800 mg by mouth 2 (two) times daily.      Marland Kitchen HYDROcodone-acetaminophen (NORCO/VICODIN) 5-325 MG per tablet Take 1 tablet by mouth 3 (three) times daily as needed for moderate pain.        Marland Kitchen loratadine (CLARITIN) 10 MG tablet Take 10 mg by mouth as needed.       . Probiotic Product (ALIGN) 4 MG CAPS Take 1 capsule by mouth daily.       . traZODone (DESYREL) 150 MG tablet Take 150 mg by mouth at bedtime. For sleep      . zolpidem (AMBIEN) 10 MG tablet Take 10 mg by mouth at bedtime.       No current facility-administered medications for this visit.    Allergies:   No Known Allergies  Social History:  The patient  reports that he quit smoking about 21 years ago. He has never used smokeless tobacco. He reports that he drinks about 1.2 ounces of alcohol per week. He reports that he does not use illicit drugs.  Used to be in [employer], back injury.   Family History  Problem Relation Age of Onset  . CAD    . CAD Father   . CAD Brother   . Hypertension Brother   . Hyperlipidemia Brother   . Alzheimer's disease Mother   . Hyperlipidemia Sister   . Hypertension Sister     ROS:  Please see the history of present illness.   Denies any strokelike symptoms, fevers, chills, rash, syncope, bleeding, orthopnea, PND.  All other systems reviewed and negative.   PHYSICAL EXAM: VS:  BP 128/80  Pulse 76  Ht 5' 10.5" (1.791 m)  Wt 230 lb (104.327 kg)  BMI 32.52 kg/m2 Well nourished, well developed, in no acute distress HEENT: normal, [place]/AT, EOMI Neck: no JVD, normal carotid upstroke, no bruit Cardiac:  normal S1, S2; RRR; no murmur Lungs:  clear to auscultation bilaterally, no wheezing, rhonchi or rales Abd: soft, nontender, no hepatomegaly, no bruits Ext: no edema, 2+ distal pulses Skin: warm and dry GU: deferred Neuro: no focal abnormalities noted, AAO x 3  EKG:  Normal sinus rhythm, nonspecific T-wave flattening, heart rate 76  ASSESSMENT AND PLAN:  1. Chest pain-could certainly be secondary to underlying GERD however given his father's family history. EKG should allow [place] to proceed with exercise treadmill test. If symptoms become worse and are more worrisome in  the future, further cardiac testing may be warranted. Continue with aggressive primary prevention strategies including exercise, dietary modifications. 2. Dyspnea-also could be multifactorial including deconditioning. Nonetheless, I will check an echocardiogram to ensure proper structure and function of his heart. Remote tobacco history quit over 20 years ago. Could consider  PFTs if echocardiogram and cardiac workup unremarkable. 3. GERD-possible etiology for symptoms. On several medications. 4. We will followup with studies.  Signed, Donato Schultz, MD [facility]  03/16/2013 11:33 AM

## 2013-03-16 NOTE — Patient Instructions (Signed)
Your physician has requested that you have an exercise tolerance test. Please also follow instruction sheet, as given.  Your physician has requested that you have an echocardiogram. Echocardiography is a painless test that uses sound waves to create images of your heart. It provides your doctor with information about the size and shape of your heart and how well your heart's chambers and valves are working. This procedure takes approximately one hour. There are no restrictions for this procedure.  Your physician recommends that you schedule a follow-up appointment in: AFTER TESTS ARE DONE TO DISCUSS RESULTS

## 2013-03-31 ENCOUNTER — Ambulatory Visit (INDEPENDENT_AMBULATORY_CARE_PROVIDER_SITE_OTHER): Payer: 59 | Admitting: Physician Assistant

## 2013-03-31 DIAGNOSIS — R079 Chest pain, unspecified: Secondary | ICD-10-CM

## 2013-03-31 NOTE — Progress Notes (Signed)
Overall reassuring stress treadmill. If symptoms worsen or become more worrisome, please come back for office visit.

## 2013-03-31 NOTE — Progress Notes (Signed)
Exercise Treadmill Test  Pre-Exercise Testing Evaluation Rhythm: normal sinus  Rate: 65 bpm     Test  Exercise Tolerance Test Ordering MD: Candee Furbish, MD  Interpreting MD: Richardson Dopp, PA-C  Unique Test No: 1  Treadmill:  1  Indication for ETT: chest pain - rule out ischemia  Contraindication to ETT: No   Stress Modality: exercise - treadmill  Cardiac Imaging Performed: non   Protocol: standard Bruce - maximal  Max BP:  164/88  Max MPHR (bpm):  160 85% MPR (bpm):  136  MPHR obtained (bpm):  150 % MPHR obtained:  93  Reached 85% MPHR (min:sec):  6:43 Total Exercise Time (min-sec):  8:00  Workload in METS:  10.1 Borg Scale: 19  Reason ETT Terminated:  desired heart rate attained    ST Segment Analysis At Rest: normal ST segments - no evidence of significant ST depression With Exercise: non-specific ST changes  Other Information Arrhythmia:  No Angina during ETT:  absent (0) Quality of ETT:  diagnostic  ETT Interpretation:  normal - no evidence of ischemia by ST analysis  Comments: Good exercise capacity. No chest pain. Normal BP response to exercise. No significant ST changes to suggest ischemia.   Recommendations: F/u with Dr. Candee Furbish as directed. Signed, Richardson Dopp, PA-C   03/31/2013 9:36 AM

## 2013-04-05 ENCOUNTER — Encounter: Payer: Self-pay | Admitting: Cardiovascular Disease

## 2013-04-05 ENCOUNTER — Ambulatory Visit (HOSPITAL_COMMUNITY): Payer: 59 | Attending: Cardiology | Admitting: Radiology

## 2013-04-05 DIAGNOSIS — G4730 Sleep apnea, unspecified: Secondary | ICD-10-CM | POA: Insufficient documentation

## 2013-04-05 DIAGNOSIS — Z87891 Personal history of nicotine dependence: Secondary | ICD-10-CM | POA: Insufficient documentation

## 2013-04-05 DIAGNOSIS — R0602 Shortness of breath: Secondary | ICD-10-CM

## 2013-04-05 DIAGNOSIS — R0600 Dyspnea, unspecified: Secondary | ICD-10-CM

## 2013-04-05 DIAGNOSIS — Z8249 Family history of ischemic heart disease and other diseases of the circulatory system: Secondary | ICD-10-CM | POA: Insufficient documentation

## 2013-04-05 NOTE — Progress Notes (Signed)
Echocardiogram performed.  

## 2013-05-12 ENCOUNTER — Other Ambulatory Visit: Payer: Self-pay | Admitting: Orthopedic Surgery

## 2013-05-12 ENCOUNTER — Encounter (HOSPITAL_BASED_OUTPATIENT_CLINIC_OR_DEPARTMENT_OTHER): Payer: Self-pay | Admitting: *Deleted

## 2013-05-12 NOTE — Progress Notes (Signed)
Pt has had chronic pain-had cp-stress test dr Marlou Porch 1/15-normal-ekg done will try to come in for bmet Had a sleep test-was to try cpap-could not sleep-does not use

## 2013-05-12 NOTE — Progress Notes (Signed)
05/12/13 1239  OBSTRUCTIVE SLEEP APNEA  Have you ever been diagnosed with sleep apnea through a sleep study? Yes  If yes, do you have and use a CPAP or BPAP machine every night? 0  Do you snore loudly (loud enough to be heard through closed doors)?  1  Do you often feel tired, fatigued, or sleepy during the daytime? 0  Has anyone observed you stop breathing during your sleep? 1  Do you have, or are you being treated for high blood pressure? 1  BMI more than 35 kg/m2? 0  Age over 42 years old? 1  Neck circumference greater than 40 cm/18 inches? 0  Gender: 1  Obstructive Sleep Apnea Score 5  Score 4 or greater  Results sent to PCP

## 2013-05-16 ENCOUNTER — Encounter (HOSPITAL_BASED_OUTPATIENT_CLINIC_OR_DEPARTMENT_OTHER)
Admission: RE | Admit: 2013-05-16 | Discharge: 2013-05-16 | Disposition: A | Discharge status: Home / Self Care | Payer: 59 | Source: Ambulatory Visit | Attending: Orthopedic Surgery | Admitting: Orthopedic Surgery

## 2013-05-16 LAB — BASIC METABOLIC PANEL
BUN: 20 mg/dL (ref 6–23)
CHLORIDE: 101 meq/L (ref 96–112)
CO2: 30 meq/L (ref 19–32)
CREATININE: 0.91 mg/dL (ref 0.50–1.35)
Calcium: 9.6 mg/dL (ref 8.4–10.5)
GFR calc non Af Amer: >90 mL/min (ref >90)
Glucose, Bld: 118 mg/dL — ABNORMAL HIGH (ref 70–99)
POTASSIUM: 4.5 meq/L (ref 3.7–5.3)
Sodium: 142 mEq/L (ref 137–147)

## 2013-05-16 NOTE — H&P (Signed)
[name] is an 63 y.o. male.   Chief Complaint: c/o chronic and progressive numbness and tingling of the left hand HPI: .   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  He was worked up by [name] at [place] and had electrodiagnostic studies identifying right carpal tunnel syndrome.  He subsequently had right carpal tunnel surgery by [name] through an extended palm and distal forearm incision.  He has had crepitation at the wrist with finger and wrist motion since surgery and lancinating pain towards his shoulder.  He also reports pain at his [facility] joints of his right and left thumbs.  He has history of cervical degenerative disc disease and lumbar degenerative disc disease.  He was seen by the rheumatologist and told that he had background arthritis, but not inflammatory arthritis.      Past Medical History  Diagnosis Date  . Asthma   . Degenerative disk disease   . Diverticulitis   . Gastric ulcer   . Elevated blood pressure 03/02/13    (x) 2 years. Referral to cardiology  . SOB (shortness of breath)   . Chronic back pain     On celebrex  . Chronic neck pain     On celebrex  . Midsternal chest pain     Has had for a while, which he contributed to hiatal hernia and peptic disease. No radiation, not sure about duration, not associated with diaphoresis.  . Dizziness     occational  . DOE (dyspnea on exertion)     occasional. Now can also feel when he is at rest. Former smoker.  . Peptic disease   . Hiatal hernia   . Chest discomfort     Could be related to hiatal hernia but because of Hx of HTN, HLD and + FHX, an exercise stress test & referral to cardiology will be arranged.  . Esophageal reflux   . Benign essential hypertension     Low salt diet. Systolic goal <130, diastolic goal <86.  Marland Kitchen Hyperlipidemia     Last FLP in 2012 slightly elevated. LDL goal <100.  Marland Kitchen Allergic rhinitis, cause unspecified   . Cervicalgia   . Pain in  joint, site unspecified   . Sleep disturbance, unspecified   . Chronic insomnia   . Abdominal pain   . Dyspepsia   . Diarrhea   . Blood in stool   . Tremor     Per [name]  . ENT complaint     Per [name]  . Diverticulosis   . Carpal tunnel syndrome, bilateral   . HOH (hard of hearing)   . Sleep apnea syndrome     was to try cpap-did not get one-repeat studies could not sleep    Past Surgical History  Procedure Laterality Date  . Appendectomy    . Nasal septum surgery    . Colonoscopy    . Upper gi endoscopy    . Carpal tunnel release  4/14    rt    Family History  Problem Relation Age of Onset  . CAD    . CAD Father   . CAD Brother   . Hypertension Brother   . Hyperlipidemia Brother   . Alzheimer's disease Mother   . Hyperlipidemia Sister   . Hypertension Sister    Social History:  reports that he quit smoking about 22 years ago. He has never used smokeless tobacco. He reports that he  drinks about 1.2 ounces of alcohol per week. He reports that he does not use illicit drugs.  Allergies: No Known Allergies  No prescriptions prior to admission    No results found for this or any previous visit (from the past 48 hour(s)).  No results found.   Pertinent items are noted in HPI.  Height 5' 10.5" (1.791 m), weight 104.327 kg (230 lb).  General appearance: alert Head: Normocephalic, without obvious abnormality Neck: supple, symmetrical, trachea midline Resp: clear to auscultation bilaterally Cardio: regular rate and rhythm GI: normal findings: bowel sounds normal Extremities:  .  Inspection of his hands reveals a long lazy S incision on the ulnar aspect of his right wrist.  He has a rather flat appearing volar wrist contour.  He has mechanical symptoms of median nerve irritation at the wrist.  He has some popping with wrist flexion extension and rapid finger flexion extension.  His pulse and cap refill are intact. There are no dystrophic signs or symptoms.   He has no sign of stenosing tenosynovitis.  His wrist range of motion reveals palmar flexion 60 right, 50 left, dorsiflexion 80 right, 85 left.  He has a background coarse tremor.  He does have Heberden's and Bouchard's nodes evidencing background osteoarthritis.  Plain films of his right and left wrist demonstrate mild narrowing of his mid carpal joint on the right.  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    I asked [name] to complete detailed electrodiagnostic studies.  These reveal bilateral carpal tunnel syndrome.  He has not had correction of his electrodiagnostic studies on the right side.  His motor latency is 4.4 milliseconds, his lumbrical interosseous difference was 0.9, his sensory latency was 2.6 milliseconds and his amplitude was 16.7 on the right vs. 19 on the left.     Pulses: 2+ and symmetric Skin: normal Neurologic: Grossly normal    Assessment/Plan Impression: Left CTS with low sensory amplitude.  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  Plan: TO the OR for left CTR.The procedure, risks,benefits and post-op course were discussed with the patient at length and they were in agreement with the plan.  [name] 05/16/2013, 8:34 AM   H&P documentation: 05/17/2013  -History and Physical Reviewed  -Patient has been re-examined  -No change in the plan of care  Warren Sickle, MD

## 2013-05-17 ENCOUNTER — Encounter (HOSPITAL_BASED_OUTPATIENT_CLINIC_OR_DEPARTMENT_OTHER): Payer: Self-pay | Admitting: *Deleted

## 2013-05-17 ENCOUNTER — Encounter (HOSPITAL_BASED_OUTPATIENT_CLINIC_OR_DEPARTMENT_OTHER)
Admission: RE | Disposition: A | Discharge status: Home / Self Care | Payer: Self-pay | Source: Ambulatory Visit | Attending: Orthopedic Surgery

## 2013-05-17 ENCOUNTER — Ambulatory Visit (HOSPITAL_BASED_OUTPATIENT_CLINIC_OR_DEPARTMENT_OTHER): Payer: 59 | Admitting: Anesthesiology

## 2013-05-17 ENCOUNTER — Encounter (HOSPITAL_BASED_OUTPATIENT_CLINIC_OR_DEPARTMENT_OTHER): Payer: 59 | Admitting: Anesthesiology

## 2013-05-17 ENCOUNTER — Ambulatory Visit (HOSPITAL_BASED_OUTPATIENT_CLINIC_OR_DEPARTMENT_OTHER)
Admission: RE | Admit: 2013-05-17 | Discharge: 2013-05-17 | Disposition: A | Discharge status: Home / Self Care | Payer: 59 | Source: Ambulatory Visit | Attending: Orthopedic Surgery | Admitting: Orthopedic Surgery

## 2013-05-17 DIAGNOSIS — E785 Hyperlipidemia, unspecified: Secondary | ICD-10-CM | POA: Insufficient documentation

## 2013-05-17 DIAGNOSIS — G5600 Carpal tunnel syndrome, unspecified upper limb: Secondary | ICD-10-CM | POA: Insufficient documentation

## 2013-05-17 DIAGNOSIS — I10 Essential (primary) hypertension: Secondary | ICD-10-CM | POA: Insufficient documentation

## 2013-05-17 DIAGNOSIS — J45909 Unspecified asthma, uncomplicated: Secondary | ICD-10-CM | POA: Insufficient documentation

## 2013-05-17 DIAGNOSIS — K219 Gastro-esophageal reflux disease without esophagitis: Secondary | ICD-10-CM | POA: Insufficient documentation

## 2013-05-17 DIAGNOSIS — G4730 Sleep apnea, unspecified: Secondary | ICD-10-CM | POA: Insufficient documentation

## 2013-05-17 DIAGNOSIS — Z87891 Personal history of nicotine dependence: Secondary | ICD-10-CM | POA: Insufficient documentation

## 2013-05-17 HISTORY — DX: Unspecified hearing loss, unspecified ear: H91.90

## 2013-05-17 HISTORY — PX: CARPAL TUNNEL RELEASE: SHX101

## 2013-05-17 LAB — POCT HEMOGLOBIN-HEMACUE: Hemoglobin: 13.7 g/dL (ref 13.0–17.0)

## 2013-05-17 SURGERY — CARPAL TUNNEL RELEASE
Anesthesia: General | Site: Wrist | Laterality: Left

## 2013-05-17 MED ORDER — CHLORHEXIDINE GLUCONATE 4 % EX LIQD: 60.0000 mL | Freq: Once | CUTANEOUS | Status: DC

## 2013-05-17 MED ORDER — LIDOCAINE HCL 2 % IJ SOLN
INTRAMUSCULAR | Status: DC | PRN
  Administered 2013-05-17: 5.5 mL

## 2013-05-17 MED ORDER — OXYCODONE HCL 5 MG PO TABS: 5.0000 mg | ORAL_TABLET | Freq: Once | ORAL | Status: DC | PRN

## 2013-05-17 MED ORDER — OXYCODONE-ACETAMINOPHEN 5-325 MG PO TABS: ORAL_TABLET | ORAL | Status: DC

## 2013-05-17 MED ORDER — ONDANSETRON HCL 4 MG/2ML IJ SOLN
INTRAMUSCULAR | Status: DC | PRN
  Administered 2013-05-17: 4 mg via INTRAVENOUS

## 2013-05-17 MED ORDER — FENTANYL CITRATE 0.05 MG/ML IJ SOLN
INTRAMUSCULAR | Status: AC
  Filled 2013-05-17: qty 4

## 2013-05-17 MED ORDER — LIDOCAINE HCL 2 % IJ SOLN
INTRAMUSCULAR | Status: AC
  Filled 2013-05-17: qty 20

## 2013-05-17 MED ORDER — MIDAZOLAM HCL 2 MG/2ML IJ SOLN
INTRAMUSCULAR | Status: AC
  Filled 2013-05-17: qty 2

## 2013-05-17 MED ORDER — PROPOFOL 10 MG/ML IV BOLUS
INTRAVENOUS | Status: DC | PRN
  Administered 2013-05-17: 200 mg via INTRAVENOUS

## 2013-05-17 MED ORDER — LACTATED RINGERS IV SOLN
INTRAVENOUS | Status: DC
  Administered 2013-05-17 (×3): via INTRAVENOUS

## 2013-05-17 MED ORDER — METHYLPREDNISOLONE ACETATE 40 MG/ML IJ SUSP
INTRAMUSCULAR | Status: AC
  Filled 2013-05-17: qty 1

## 2013-05-17 MED ORDER — FENTANYL CITRATE 0.05 MG/ML IJ SOLN
INTRAMUSCULAR | Status: DC | PRN
  Administered 2013-05-17: 25 ug via INTRAVENOUS
  Administered 2013-05-17: 50 ug via INTRAVENOUS

## 2013-05-17 MED ORDER — HYDROMORPHONE HCL PF 1 MG/ML IJ SOLN: 0.2500 mg | INTRAMUSCULAR | Status: DC | PRN

## 2013-05-17 MED ORDER — MIDAZOLAM HCL 2 MG/2ML IJ SOLN: 1.0000 mg | INTRAMUSCULAR | Status: DC | PRN

## 2013-05-17 MED ORDER — MIDAZOLAM HCL 5 MG/5ML IJ SOLN
INTRAMUSCULAR | Status: DC | PRN
  Administered 2013-05-17: 2 mg via INTRAVENOUS

## 2013-05-17 MED ORDER — LIDOCAINE HCL (CARDIAC) 20 MG/ML IV SOLN
INTRAVENOUS | Status: DC | PRN
  Administered 2013-05-17: 40 mg via INTRAVENOUS

## 2013-05-17 MED ORDER — FENTANYL CITRATE 0.05 MG/ML IJ SOLN: 50.0000 ug | INTRAMUSCULAR | Status: DC | PRN

## 2013-05-17 MED ORDER — METHYLPREDNISOLONE ACETATE 80 MG/ML IJ SUSP
INTRAMUSCULAR | Status: AC
  Filled 2013-05-17: qty 1

## 2013-05-17 MED ORDER — OXYCODONE HCL 5 MG/5ML PO SOLN: 5.0000 mg | Freq: Once | ORAL | Status: DC | PRN

## 2013-05-17 MED ORDER — DEXAMETHASONE SODIUM PHOSPHATE 4 MG/ML IJ SOLN
INTRAMUSCULAR | Status: DC | PRN
  Administered 2013-05-17: 10 mg via INTRAVENOUS

## 2013-05-17 SURGICAL SUPPLY — 41 items
BANDAGE ADH SHEER 1  50/CT (GAUZE/BANDAGES/DRESSINGS) IMPLANT
BANDAGE ELASTIC 3 VELCRO ST LF (GAUZE/BANDAGES/DRESSINGS) ×2 IMPLANT
BLADE SURG 15 STRL LF DISP TIS (BLADE) ×1 IMPLANT
BLADE SURG 15 STRL SS (BLADE) ×2
BNDG CMPR 9X4 STRL LF SNTH (GAUZE/BANDAGES/DRESSINGS) ×1
BNDG COHESIVE 3X5 TAN STRL LF (GAUZE/BANDAGES/DRESSINGS) ×2 IMPLANT
BNDG ESMARK 4X9 LF (GAUZE/BANDAGES/DRESSINGS) ×1 IMPLANT
BRUSH SCRUB EZ PLAIN DRY (MISCELLANEOUS) ×2 IMPLANT
CORDS BIPOLAR (ELECTRODE) IMPLANT
COVER MAYO STAND STRL (DRAPES) ×2 IMPLANT
COVER TABLE BACK 60X90 (DRAPES) ×2 IMPLANT
CUFF TOURNIQUET SINGLE 18IN (TOURNIQUET CUFF) ×1 IMPLANT
DECANTER SPIKE VIAL GLASS SM (MISCELLANEOUS) ×2 IMPLANT
DRAPE EXTREMITY T 121X128X90 (DRAPE) ×2 IMPLANT
DRAPE SURG 17X23 STRL (DRAPES) ×2 IMPLANT
GLOVE BIO SURGEON STRL SZ 6.5 (GLOVE) ×2 IMPLANT
GLOVE BIOGEL M STRL SZ7.5 (GLOVE) IMPLANT
GLOVE BIOGEL PI IND STRL 7.0 (GLOVE) ×2 IMPLANT
GLOVE BIOGEL PI INDICATOR 7.0 (GLOVE) ×2
GLOVE ORTHO TXT STRL SZ7.5 (GLOVE) ×2 IMPLANT
GOWN STRL REUS W/ TWL LRG LVL3 (GOWN DISPOSABLE) ×1 IMPLANT
GOWN STRL REUS W/ TWL XL LVL3 (GOWN DISPOSABLE) ×2 IMPLANT
GOWN STRL REUS W/TWL LRG LVL3 (GOWN DISPOSABLE) ×2
GOWN STRL REUS W/TWL XL LVL3 (GOWN DISPOSABLE) ×4
NEEDLE 27GAX1X1/2 (NEEDLE) ×2 IMPLANT
PACK BASIN DAY SURGERY FS (CUSTOM PROCEDURE TRAY) ×2 IMPLANT
PAD CAST 3X4 CTTN HI CHSV (CAST SUPPLIES) ×1 IMPLANT
PADDING CAST ABS 4INX4YD NS (CAST SUPPLIES) ×1
PADDING CAST ABS COTTON 4X4 ST (CAST SUPPLIES) ×1 IMPLANT
PADDING CAST COTTON 3X4 STRL (CAST SUPPLIES) ×2
SPLINT PLASTER CAST XFAST 3X15 (CAST SUPPLIES) ×5 IMPLANT
SPLINT PLASTER XTRA FASTSET 3X (CAST SUPPLIES) ×5
SPONGE GAUZE 4X4 12PLY (GAUZE/BANDAGES/DRESSINGS) ×2 IMPLANT
STOCKINETTE 4X48 STRL (DRAPES) ×2 IMPLANT
STRIP CLOSURE SKIN 1/2X4 (GAUZE/BANDAGES/DRESSINGS) ×2 IMPLANT
SUT PROLENE 3 0 PS 2 (SUTURE) ×2 IMPLANT
SYR 3ML 23GX1 SAFETY (SYRINGE) IMPLANT
SYR CONTROL 10ML LL (SYRINGE) ×2 IMPLANT
TOWEL OR 17X24 6PK STRL BLUE (TOWEL DISPOSABLE) ×2 IMPLANT
TRAY DSU PREP LF (CUSTOM PROCEDURE TRAY) ×2 IMPLANT
UNDERPAD 30X30 INCONTINENT (UNDERPADS AND DIAPERS) ×2 IMPLANT

## 2013-05-17 NOTE — Discharge Instructions (Addendum)

## 2013-05-17 NOTE — Anesthesia Postprocedure Evaluation (Signed)
  Anesthesia Post-op Note  Patient: [name]  Procedure(s) Performed: Procedure(s): LEFT CARPAL TUNNEL RELEASE (Left)  Patient Location: PACU  Anesthesia Type:General  Level of Consciousness: awake and alert   Airway and Oxygen Therapy: Patient Spontanous Breathing  Post-op Pain: none  Post-op Assessment: Post-op Vital signs reviewed, Patient's Cardiovascular Status Stable and Respiratory Function Stable  Post-op Vital Signs: Reviewed  Filed Vitals:   05/17/13 0845  BP: 123/83  Pulse: 71  Temp:   Resp: 12    Complications: No apparent anesthesia complications

## 2013-05-17 NOTE — Op Note (Signed)
NAME:  [name], [name] NO.:  [id number]  MEDICAL RECORD NO.:  [id number]  LOCATION:                                 FACILITY:  PHYSICIAN:  Youlanda Mighty. Maryruth Apple, M.D. 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  OPERATION:  Release of left transverse carpal ligament.  OPERATING SURGEON:  Youlanda Mighty. Dellie Piasecki, MD.  ASSISTANT:  Surgical technician.  ANESTHESIA:  General by LMA.  SUPERVISING ANESTHESIOLOGIST:  Soledad Gerlach, MD.  INDICATIONS:  [name] is a 63 year old gentleman referred through the courtesy of Dr. Briscoe Deutscher of [place], [facility] for evaluation and management of chronic hand numbness and discomfort.  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  He has a history of bilateral hand numbness.  He was worked up by Dr. Brien Few of [facility] and had electrodiagnostic studies revealing right carpal tunnel syndrome.  He is status post prior right carpal tunnel release by Dr. Luiz Ochoa in April 2014.  He has not had relief of his numbness on the right, therefore he sought a Hand Surgery opinion regarding his right and left hands.  Clinical examination revealed evidence of persisting carpal tunnel syndrome on the left and failure to have complete relief of his symptoms on the right.  He had a repeat electrodiagnostic studies by Dr. Zebedee Iba at our office and was noted to have persistent abnormalities of median conduction on motor latency, sensory latency, and lumbrical interosseous difference studies bilaterally.  We advised [name] to undergo left carpal tunnel release, anticipating a second try to observe his response to decompression of the  carpal canal.  It is possible that he has a peripheral neuropathy, although his electrodiagnostic studies completed by Dr. Zebedee Iba did not suggest this.  After detailed informed consent, he was brought to the operating room at this time anticipating release of the left transverse carpal ligament through a limited palmar incision.  DESCRIPTION OF PROCEDURE:  Simmie Guzman was brought to room 2 of the [facility] and placed in supine position upon the operating table.  Dr. Ola Spurr of Anesthesia had provided detailed anesthesia informed consent in the holding area.  Questions regarding the anticipated surgery were invited and answered in detail.  The left hand was marked as the proper surgical site with a marking pen per protocol in the holding area.  In the room 2, under Dr. Blane Ohara direct supervision, general anesthesia by LMA technique was induced followed by routine Betadine scrub and paint of the left upper extremity.  Sterile stockinette, impervious arthroscopy drapes were applied followed by exsanguination of the left hand and arm with Esmarch bandage, inflation of the arterial tourniquet on the proximal brachium to 220 mmHg.  Following routine surgical time-out, procedure commenced with a 2-cm incision in the line of the ring finger in the proximal palm. Subcutaneous tissues were carefully divided taking care to look for the palmar cutaneous branch and other sensory branches.  Bleeding points were electrocauterized with bipolar current.  The palmar fascia was split in line of its fibers.  The superficial palmar arch was identified.  The position of the median nerve, common sensory branches were noted and a Penfield 4 elevators were used to sound the canal.  The transverse carpal ligament was released along its ulnar border with scissors extending into the distal forearm.  The volar forearm fascia was released subcutaneously with scissors as well.  The  wound was inspected for bleeding points, which were electrocauterized with bipolar current.  There was no impediment of the motor branch noted.  The wound was then repaired with intradermal 3-0 Prolene.  Steri-Strips applied followed by infiltration of 2% lidocaine around the median nerve in the distal forearm and along the wound margins for postoperative comfort.  The wound was then dressed with sterile gauze, sterile Webril, and a volar plaster splint maintaining the wrist in 15 degrees dorsiflexion. Coban was used to provide compression.  For aftercare, [name] is provided a prescription for Percocet 5 mg 1 p.o. q.4-6 hours p.r.n. pain, 20 tablets.  He will also use over-the- counter analgesics as necessary for minor pain.     Youlanda Mighty Nicholous Girgenti, M.D.     RVS/MEDQ  D:  05/17/2013  T:  05/17/2013  Job:  [id number]  cc:   Herbie Baltimore L. Maceo Pro, M.D.

## 2013-05-17 NOTE — Anesthesia Preprocedure Evaluation (Signed)
Anesthesia Evaluation  Patient identified by MRN, date of birth, ID band Patient awake    Reviewed: Allergy & Precautions, H&P , NPO status , Patient's Chart, lab work & pertinent test results  Airway Mallampati: I TM Distance: >3 FB Neck ROM: Full    Dental no notable dental hx. (+) Teeth Intact, Dental Advisory Given   Pulmonary asthma , sleep apnea , former smoker,  breath sounds clear to auscultation  Pulmonary exam normal       Cardiovascular hypertension, negative cardio ROS  Rhythm:Regular Rate:Normal     Neuro/Psych negative neurological ROS  negative psych ROS   GI/Hepatic Neg liver ROS, hiatal hernia, PUD, GERD-  Medicated and Controlled,  Endo/Other  negative endocrine ROS  Renal/GU negative Renal ROS  negative genitourinary   Musculoskeletal   Abdominal   Peds  Hematology negative hematology ROS (+)   Anesthesia Other Findings   Reproductive/Obstetrics negative OB ROS                           Anesthesia Physical Anesthesia Plan  ASA: III  Anesthesia Plan: General   Post-op Pain Management:    Induction: Intravenous  Airway Management Planned: LMA  Additional Equipment:   Intra-op Plan:   Post-operative Plan: Extubation in OR  Informed Consent: I have reviewed the patients History and Physical, chart, labs and discussed the procedure including the risks, benefits and alternatives for the proposed anesthesia with the patient or authorized representative who has indicated his/her understanding and acceptance.   Dental advisory given  Plan Discussed with: CRNA  Anesthesia Plan Comments:         Anesthesia Quick Evaluation

## 2013-05-17 NOTE — Op Note (Signed)
[id number] 

## 2013-05-17 NOTE — Transfer of Care (Signed)
Immediate Anesthesia Transfer of Care Note  Patient: [name]  Procedure(s) Performed: Procedure(s): LEFT CARPAL TUNNEL RELEASE (Left)  Patient Location: PACU  Anesthesia Type:General  Level of Consciousness: awake, alert  and patient cooperative  Airway & Oxygen Therapy: Patient Spontanous Breathing and Patient connected to face mask oxygen  Post-op Assessment: Report given to PACU RN and Post -op Vital signs reviewed and stable  Post vital signs: Reviewed and stable  Complications: No apparent anesthesia complications

## 2013-05-17 NOTE — Brief Op Note (Signed)
05/17/2013  8:12 AM  PATIENT:  [name]  63 y.o. male  PRE-OPERATIVE DIAGNOSIS:  LEFT CARPAL TUNNEL SYNDROME  POST-OPERATIVE DIAGNOSIS:  LEFT CARPAL TUNNEL SYNDROME  PROCEDURE:  Procedure(s): LEFT CARPAL TUNNEL RELEASE (Left)  SURGEON:  Surgeon(s) and Role:    * Cammie Sickle., MD - Primary  PHYSICIAN ASSISTANT:   ASSISTANTS: surgical tech   ANESTHESIA:   General by LMA  EBL:  Total I/O In: 1000 [I.V.:1000] Out: -   BLOOD ADMINISTERED:none  DRAINS: none   LOCAL MEDICATIONS USED:  XYLOCAINE   SPECIMEN:  No Specimen  DISPOSITION OF SPECIMEN:  PATHOLOGY  COUNTS:  YES  TOURNIQUET:   Total Tourniquet Time Documented: Forearm (Left) - 9 minutes Total: Forearm (Left) - 9 minutes   DICTATION: .Other Dictation: Dictation Number [id number]  PLAN OF CARE: Discharge to home after PACU  PATIENT DISPOSITION:  PACU - hemodynamically stable.   Delay start of Pharmacological VTE agent (>24hrs) due to surgical blood loss or risk of bleeding: not applicable

## 2013-05-17 NOTE — Anesthesia Procedure Notes (Signed)
Procedure Name: LMA Insertion Date/Time: 05/17/2013 7:48 AM Performed by: Toula Moos Pre-anesthesia Checklist: Patient identified, Emergency Drugs available, Suction available, Patient being monitored and Timeout performed Patient Re-evaluated:Patient Re-evaluated prior to inductionOxygen Delivery Method: Circle System Utilized Preoxygenation: Pre-oxygenation with 100% oxygen Intubation Type: IV induction Ventilation: Mask ventilation without difficulty LMA: LMA inserted LMA Size: 5.0 Number of attempts: 1 Airway Equipment and Method: bite block Placement Confirmation: positive ETCO2 and breath sounds checked- equal and bilateral Tube secured with: Tape Dental Injury: Teeth and Oropharynx as per pre-operative assessment

## 2013-05-18 ENCOUNTER — Encounter (HOSPITAL_BASED_OUTPATIENT_CLINIC_OR_DEPARTMENT_OTHER): Payer: Self-pay | Admitting: Orthopedic Surgery

## 2013-09-26 ENCOUNTER — Telehealth: Payer: Self-pay | Admitting: Neurology

## 2013-09-26 NOTE — Telephone Encounter (Signed)
Received 10 pages of records from consultation with Dr. Theodis Sato at [facility], sent to Dr. Posey Pronto at [facility]. 09/26/13/ss.

## 2013-10-24 ENCOUNTER — Ambulatory Visit (INDEPENDENT_AMBULATORY_CARE_PROVIDER_SITE_OTHER): Payer: 59 | Admitting: Neurology

## 2013-10-24 ENCOUNTER — Encounter: Payer: Self-pay | Admitting: Neurology

## 2013-10-24 VITALS — BP 130/80 | HR 69 | Ht 70.87 in | Wt 238.0 lb

## 2013-10-24 DIAGNOSIS — G5603 Carpal tunnel syndrome, bilateral upper limbs: Secondary | ICD-10-CM

## 2013-10-24 DIAGNOSIS — R209 Unspecified disturbances of skin sensation: Secondary | ICD-10-CM

## 2013-10-24 DIAGNOSIS — G609 Hereditary and idiopathic neuropathy, unspecified: Secondary | ICD-10-CM

## 2013-10-24 DIAGNOSIS — M5412 Radiculopathy, cervical region: Secondary | ICD-10-CM

## 2013-10-24 DIAGNOSIS — G5600 Carpal tunnel syndrome, unspecified upper limb: Secondary | ICD-10-CM

## 2013-10-24 NOTE — Patient Instructions (Signed)
1.  Check blood work 2.  NCS/EMG of the left arm and leg 3.  Return to clinic in 6 weeks

## 2013-10-24 NOTE — Progress Notes (Signed)
Note faxed to both providers.
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
## 2013-10-25 LAB — TSH: TSH: 1.999 u[IU]/mL (ref 0.350–4.500)

## 2013-10-25 LAB — VITAMIN B12: VITAMIN B 12: 318 pg/mL (ref 211–911)

## 2013-10-25 LAB — SEDIMENTATION RATE: Sed Rate: 5 mm/hr (ref 0–16)

## 2013-10-26 LAB — UIFE/LIGHT CHAINS/TP QN, 24-HR UR
Albumin, U: DETECTED
FREE KAPPA/LAMBDA RATIO: 11.6 ratio — AB (ref 2.04–10.37)
Free Kappa Lt Chains,Ur: 0.58 mg/dL (ref 0.14–2.42)
Free Lambda Lt Chains,Ur: 0.05 mg/dL (ref 0.02–0.67)
TOTAL PROTEIN, URINE-UPE24: 1.3 mg/dL

## 2013-10-26 LAB — CERULOPLASMIN: Ceruloplasmin: 29 mg/dL (ref 18–36)

## 2013-10-26 LAB — SPEP & IFE WITH QIG
ALPHA-2-GLOBULIN: 8.9 % (ref 7.1–11.8)
Albumin ELP: 61.5 % (ref 55.8–66.1)
Alpha-1-Globulin: 4.4 % (ref 2.9–4.9)
Beta 2: 4 % (ref 3.2–6.5)
Beta Globulin: 6.3 % (ref 4.7–7.2)
GAMMA GLOBULIN: 14.9 % (ref 11.1–18.8)
IgA: 224 mg/dL (ref 68–379)
IgG (Immunoglobin G), Serum: 1150 mg/dL (ref 650–1600)
IgM, Serum: 77 mg/dL (ref 41–251)
TOTAL PROTEIN, SERUM ELECTROPHOR: 7.3 g/dL (ref 6.0–8.3)

## 2013-10-26 LAB — COPPER, SERUM: Copper: 103 ug/dL (ref 70–175)

## 2013-10-28 LAB — VITAMIN B1: Vitamin B1 (Thiamine): 16 nmol/L (ref 8–30)

## 2013-11-01 ENCOUNTER — Other Ambulatory Visit: Payer: 59

## 2013-11-01 DIAGNOSIS — R209 Unspecified disturbances of skin sensation: Secondary | ICD-10-CM

## 2013-11-01 DIAGNOSIS — G5603 Carpal tunnel syndrome, bilateral upper limbs: Secondary | ICD-10-CM

## 2013-11-01 DIAGNOSIS — M5412 Radiculopathy, cervical region: Secondary | ICD-10-CM

## 2013-11-01 DIAGNOSIS — G609 Hereditary and idiopathic neuropathy, unspecified: Secondary | ICD-10-CM

## 2013-11-01 LAB — GLUCOSE TOLERANCE, 2 HOURS
GLUCOSE, 2 HOUR: 99 mg/dL
GLUCOSE, FASTING: 103 mg/dL — AB (ref 70–99)
Glucose, 1 Hour GTT: 133 mg/dL

## 2013-12-15 ENCOUNTER — Ambulatory Visit (INDEPENDENT_AMBULATORY_CARE_PROVIDER_SITE_OTHER): Payer: 59 | Admitting: Neurology

## 2013-12-15 DIAGNOSIS — G5603 Carpal tunnel syndrome, bilateral upper limbs: Secondary | ICD-10-CM

## 2013-12-15 DIAGNOSIS — G609 Hereditary and idiopathic neuropathy, unspecified: Secondary | ICD-10-CM

## 2013-12-15 DIAGNOSIS — M5412 Radiculopathy, cervical region: Secondary | ICD-10-CM

## 2013-12-15 DIAGNOSIS — R209 Unspecified disturbances of skin sensation: Secondary | ICD-10-CM

## 2013-12-15 NOTE — Procedures (Signed)
[facility]  [place]  [place], [place] [address] Tel: [phone] Fax:  [phone] Test Date:  12/15/2013  Patient: [name] DOB: 09-30-1950 Physician: Narda Amber, DO  Sex: Male Height: 5\' 11"  Ref Phys: Narda Amber  ID#: [id number] Temp: 34.0C Technician: Laureen Ochs R. NCS T.   Patient Complaints: This is a 63 year old gentleman here for evaluation of paresthesias involving bilateral hands and feet. He has a previous history of bilateral carpal tunnel syndrome release    NCV & EMG Findings: Extensive electrodiagnostic testing of the left upper and lower extremity reveals:  1. Median, ulnar, sural, and palmar sensory responses are within normal limits. 2. Median ulnar motor responses are within normal limits.  3. Sural and superficial peroneal sensory responses are within normal limits.  4. Peroneal and tibial motor responses are within normal limits.  5. Tibial F wave is mildly prolonged and likely due to patient's height. Ulnar F wave is within normal limits. 6. Needle electrode examination shows chronic motor axon loss changes affecting the rectus femoris and abductor longus muscles only. There is no accompanied active denervation.   Impression: 1. Chronic L3-L4 radiculopathy affecting the left side, mild in degree electrically. 2. There is no evidence of a generalized sensorimotor polyneuropathy, carpal tunnel syndrome, or cervical radiculopathy affecting the left side.   ___________________________ Narda Amber, DO    Nerve Conduction Studies Anti Sensory Summary Table   Site NR Peak (ms) Norm Peak (ms) P-T Amp (V) Norm P-T Amp  Left Median Anti Sensory (2nd Digit)  35C  Wrist    3.5 <3.8 16.5 >10  Left Radial Anti Sensory (Base 1st Digit)  34C  Wrist    2.2 <2.8 15.0 >10  Left Sup Peroneal Anti Sensory (Ant Lat Mall)  35C  12 cm    2.8 <4.6 4.6 >3  Left Sural Anti Sensory (Lat Mall)  35C  Calf    3.7 <4.6 7.1 >3  Left  Ulnar Anti Sensory (5th Digit)  35C  Wrist    3.0 <3.2 6.2 >5   Motor Summary Table   Site NR Onset (ms) Norm Onset (ms) O-P Amp (mV) Norm O-P Amp Site1 Site2 Delta-0 (ms) Dist (cm) Vel (m/s) Norm Vel (m/s)  Left Median Motor (Abd Poll Brev)  34C  Wrist    3.4 <4.0 9.3 >5 Elbow Wrist 5.7 29.0 51 >50  Elbow    9.1  7.2         Left Peroneal Motor (Ext Dig Brev)  35C  Ankle    3.8 <6.0 5.2 >2.5 B Fib Ankle 7.6 31.5 41 >40  B Fib    11.4  4.0  Poplt B Fib 2.5 10.0 40 >40  Poplt    13.9  3.9         Left Peroneal TA Motor (Tib Ant)  35C  Fib Head    1.3 <4.5 7.4 >3 Poplit Fib Head 2.5 10.0 40 >40  Poplit    3.8  7.4         Left Tibial Motor (Abd Hall Brev)  35C  Ankle    3.9 <6.0 6.5 >4 Knee Ankle 10.6 43.0 41 >40  Knee    14.5  4.3         Left Ulnar Motor (Abd Dig Minimi)  34C  Wrist    2.5 <3.1 8.0 >7 B Elbow Wrist 4.5 26.0 58 >50  B Elbow    7.0  7.0  A Elbow B  Elbow 1.7 10.0 59 >50  A Elbow    8.7  6.8          Comparison Summary Table   Site NR Peak (ms) Norm Peak (ms) P-T Amp (V) Site1 Site2 Delta-P (ms) Norm Delta (ms)  Left Median/Ulnar Palm Comparison (Wrist - 8cm)  34C  Median Palm    2.1 <2.2 24.4 Median Palm Ulnar Palm 0.2   Ulnar Palm    1.9 <2.2 9.4       F Wave Studies   NR F-Lat (ms) Lat Norm (ms) L-R F-Lat (ms)  Left Tibial (Mrkrs) (Abd Hallucis)  35C    5'11"     58.01 <55   Left Ulnar (Mrkrs) (Abd Dig Min)  34C     32.74 <33    EMG   Side Muscle Ins Act Fibs Psw Fasc Number Recrt Dur Dur. Amp Amp. Poly Poly. Comment  Left 1stDorInt Nml Nml Nml Nml Nml Nml Nml Nml Nml Nml Nml Nml N/A  Left Ext Indicis Nml Nml Nml Nml Nml Nml Nml Nml Nml Nml Nml Nml N/A  Left PronatorTeres Nml Nml Nml Nml Nml Nml Nml Nml Nml Nml Nml Nml N/A  Left Biceps Nml Nml Nml Nml Nml Nml Nml Nml Nml Nml Nml Nml N/A  Left Triceps Nml Nml Nml Nml Nml Nml Nml Nml Nml Nml Nml Nml N/A  Left Deltoid Nml Nml Nml Nml Nml Nml Nml Nml Nml Nml Nml Nml N/A  Left Abd Poll Brev Nml Nml  Nml Nml Nml Nml Nml Nml Nml Nml Nml Nml N/A  Left AntTibialis Nml Nml Nml Nml Nml Nml Nml Nml Nml Nml Nml Nml N/A  Left Gastroc Nml Nml Nml Nml Nml Nml Nml Nml Nml Nml Nml Nml N/A  Left Flexor digitorum longus Nml Nml Nml Nml Nml Nml Nml Nml Nml Nml Nml Nml N/A  Left RectFemoris Nml Nml Nml Nml 1- Mod-R Some 1+ Some 1+ Nml Nml N/A  Left GluteusMed Nml Nml Nml Nml Nml Nml Nml Nml Nml Nml Nml Nml N/A  Left AdductorLong Nml Nml Nml Nml 1- Mod-R Some 1+ Some 1+ Nml Nml N/A      Waveforms:

## 2013-12-19 ENCOUNTER — Encounter: Payer: Self-pay | Admitting: Neurology

## 2013-12-19 ENCOUNTER — Ambulatory Visit (INDEPENDENT_AMBULATORY_CARE_PROVIDER_SITE_OTHER): Payer: 59 | Admitting: Neurology

## 2013-12-19 VITALS — BP 130/88 | HR 77 | Ht 70.0 in | Wt 237.4 lb

## 2013-12-19 DIAGNOSIS — R259 Unspecified abnormal involuntary movements: Secondary | ICD-10-CM

## 2013-12-19 DIAGNOSIS — R209 Unspecified disturbances of skin sensation: Secondary | ICD-10-CM

## 2013-12-19 DIAGNOSIS — R251 Tremor, unspecified: Secondary | ICD-10-CM

## 2013-12-19 DIAGNOSIS — M47812 Spondylosis without myelopathy or radiculopathy, cervical region: Secondary | ICD-10-CM

## 2013-12-19 DIAGNOSIS — M5412 Radiculopathy, cervical region: Secondary | ICD-10-CM

## 2013-12-19 DIAGNOSIS — M542 Cervicalgia: Secondary | ICD-10-CM
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
## 2013-12-19 NOTE — Patient Instructions (Signed)
1.  Start taking neurontin as follows: 800mg  in the morning, 400mg  in the afternoon, and 800mg  at bedtime. 2.  Start neck physiotherapy 3.  Referral to Dr. Vertell Limber with [facility] 4.  Return to clinic in 3-4 months

## 2013-12-19 NOTE — Progress Notes (Signed)
Note faxed to Dr. Vertell Limber and Dr. Maceo Pro.

## 2014-01-12 ENCOUNTER — Other Ambulatory Visit: Payer: Self-pay | Admitting: Family Medicine

## 2014-01-12 DIAGNOSIS — R1013 Epigastric pain: Secondary | ICD-10-CM

## 2014-01-13 ENCOUNTER — Ambulatory Visit (INDEPENDENT_AMBULATORY_CARE_PROVIDER_SITE_OTHER): Payer: 59

## 2014-01-13 DIAGNOSIS — K760 Fatty (change of) liver, not elsewhere classified: Secondary | ICD-10-CM

## 2014-01-13 DIAGNOSIS — R1013 Epigastric pain: Secondary | ICD-10-CM

## 2014-03-24 HISTORY — PX: OTHER SURGICAL HISTORY: SHX169

## 2014-03-28 ENCOUNTER — Encounter: Payer: Self-pay | Admitting: Neurology

## 2014-03-28 ENCOUNTER — Ambulatory Visit (INDEPENDENT_AMBULATORY_CARE_PROVIDER_SITE_OTHER): Payer: 59 | Admitting: Neurology

## 2014-03-28 VITALS — BP 124/80 | HR 72 | Ht 71.0 in | Wt 234.1 lb

## 2014-03-28 DIAGNOSIS — R209 Unspecified disturbances of skin sensation: Secondary | ICD-10-CM

## 2014-03-28 DIAGNOSIS — M47812 Spondylosis without myelopathy or radiculopathy, cervical region: Secondary | ICD-10-CM

## 2014-03-28 DIAGNOSIS — R251 Tremor, unspecified: Secondary | ICD-10-CM

## 2014-03-28 NOTE — Patient Instructions (Signed)
Continue your medications as you are Return to clinic as needed or if symptoms worsen
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
## 2014-06-23 HISTORY — PX: RETINAL DETACHMENT SURGERY: SHX105

## 2014-10-09 ENCOUNTER — Other Ambulatory Visit: Payer: Self-pay | Admitting: Neurosurgery

## 2014-11-10 ENCOUNTER — Emergency Department (HOSPITAL_BASED_OUTPATIENT_CLINIC_OR_DEPARTMENT_OTHER)
Admission: EM | Admit: 2014-11-10 | Discharge: 2014-11-10 | Disposition: A | Discharge status: Home / Self Care | Payer: 59 | Attending: Emergency Medicine | Admitting: Emergency Medicine

## 2014-11-10 ENCOUNTER — Encounter (HOSPITAL_BASED_OUTPATIENT_CLINIC_OR_DEPARTMENT_OTHER): Payer: Self-pay

## 2014-11-10 ENCOUNTER — Other Ambulatory Visit: Payer: Self-pay

## 2014-11-10 DIAGNOSIS — R109 Unspecified abdominal pain: Secondary | ICD-10-CM | POA: Insufficient documentation

## 2014-11-10 DIAGNOSIS — H9190 Unspecified hearing loss, unspecified ear: Secondary | ICD-10-CM | POA: Insufficient documentation

## 2014-11-10 DIAGNOSIS — R42 Dizziness and giddiness: Secondary | ICD-10-CM

## 2014-11-10 DIAGNOSIS — G8929 Other chronic pain: Secondary | ICD-10-CM | POA: Insufficient documentation

## 2014-11-10 DIAGNOSIS — Z7951 Long term (current) use of inhaled steroids: Secondary | ICD-10-CM | POA: Insufficient documentation

## 2014-11-10 DIAGNOSIS — K219 Gastro-esophageal reflux disease without esophagitis: Secondary | ICD-10-CM | POA: Insufficient documentation

## 2014-11-10 DIAGNOSIS — E785 Hyperlipidemia, unspecified: Secondary | ICD-10-CM | POA: Insufficient documentation

## 2014-11-10 DIAGNOSIS — I10 Essential (primary) hypertension: Secondary | ICD-10-CM | POA: Diagnosis not present

## 2014-11-10 DIAGNOSIS — Z87891 Personal history of nicotine dependence: Secondary | ICD-10-CM | POA: Diagnosis not present

## 2014-11-10 DIAGNOSIS — Z7982 Long term (current) use of aspirin: Secondary | ICD-10-CM | POA: Insufficient documentation

## 2014-11-10 DIAGNOSIS — Z79899 Other long term (current) drug therapy: Secondary | ICD-10-CM | POA: Insufficient documentation

## 2014-11-10 DIAGNOSIS — J45909 Unspecified asthma, uncomplicated: Secondary | ICD-10-CM | POA: Insufficient documentation

## 2014-11-10 LAB — COMPREHENSIVE METABOLIC PANEL WITH GFR
ALT: 21 U/L (ref 17–63)
AST: 26 U/L (ref 15–41)
Albumin: 4.3 g/dL (ref 3.5–5.0)
Alkaline Phosphatase: 61 U/L (ref 38–126)
Anion gap: 7 (ref 5–15)
BUN: 21 mg/dL — ABNORMAL HIGH (ref 6–20)
CO2: 31 mmol/L (ref 22–32)
Calcium: 9.3 mg/dL (ref 8.9–10.3)
Chloride: 102 mmol/L (ref 101–111)
Creatinine, Ser: 0.87 mg/dL (ref 0.61–1.24)
GFR calc Af Amer: >60 mL/min
GFR calc non Af Amer: >60 mL/min
Glucose, Bld: 93 mg/dL (ref 65–99)
Potassium: 3.6 mmol/L (ref 3.5–5.1)
Sodium: 140 mmol/L (ref 135–145)
Total Bilirubin: 0.7 mg/dL (ref 0.3–1.2)
Total Protein: 7.4 g/dL (ref 6.5–8.1)

## 2014-11-10 LAB — CBC WITH DIFFERENTIAL/PLATELET
BASOS PCT: 1 % (ref 0–1)
Basophils Absolute: 0 10*3/uL (ref 0.0–0.1)
Eosinophils Absolute: 0.1 10*3/uL (ref 0.0–0.7)
Eosinophils Relative: 2 % (ref 0–5)
HEMATOCRIT: 42.3 % (ref 39.0–52.0)
Hemoglobin: 13.9 g/dL (ref 13.0–17.0)
LYMPHS PCT: 38 % (ref 12–46)
Lymphs Abs: 3.2 10*3/uL (ref 0.7–4.0)
MCH: 29 pg (ref 26.0–34.0)
MCHC: 32.9 g/dL (ref 30.0–36.0)
MCV: 88.1 fL (ref 78.0–100.0)
MONO ABS: 0.6 10*3/uL (ref 0.1–1.0)
MONOS PCT: 8 % (ref 3–12)
NEUTROS ABS: 4.3 10*3/uL (ref 1.7–7.7)
Neutrophils Relative %: 51 % (ref 43–77)
Platelets: 225 10*3/uL (ref 150–400)
RBC: 4.8 MIL/uL (ref 4.22–5.81)
RDW: 13.2 % (ref 11.5–15.5)
WBC: 8.3 10*3/uL (ref 4.0–10.5)

## 2014-11-10 LAB — URINALYSIS, ROUTINE W REFLEX MICROSCOPIC
Bilirubin Urine: NEGATIVE
Glucose, UA: NEGATIVE mg/dL
Ketones, ur: NEGATIVE mg/dL
Leukocytes, UA: NEGATIVE
Nitrite: NEGATIVE
Protein, ur: NEGATIVE mg/dL
Specific Gravity, Urine: 1.028 (ref 1.005–1.030)
Urobilinogen, UA: 1 mg/dL (ref 0.0–1.0)
pH: 5.5 (ref 5.0–8.0)

## 2014-11-10 LAB — URINE MICROSCOPIC-ADD ON

## 2014-11-10 LAB — RAPID STREP SCREEN (MED CTR MEBANE ONLY): STREPTOCOCCUS, GROUP A SCREEN (DIRECT): NEGATIVE

## 2014-11-10 LAB — LIPASE, BLOOD: Lipase: 35 U/L (ref 22–51)

## 2014-11-10 NOTE — ED Provider Notes (Signed)
CSN: [id number]   Arrival date & time 11/10/14 1731  History  This chart was scribed for Malvin Johns, MD by Altamease Oiler, ED Scribe. This patient was seen in room MH01/MH01 and the patient's care was started at [place] PM.  Chief Complaint  Patient presents with  . Dizziness    HPI The history is provided by the patient. No language interpreter was used.   [name] is a 64 y.o. male who presents to the Emergency Department complaining of intermittent dizziness that worsened today. He has had episodes of dizziness for years but in the last several days the episodes have been daily, although he has had them daily in the past.  He describes the sensation as light headed and "like the place starts getting dark, like I am going to pass out".  He has been evaluated by his PCP for the dizziness in the past and was seen at the [facility] and referred to the ED today. Pt is concerned for his gallbladder because both his mother and brother have had dizziness associated with gallbladder disease. Associated symptoms include sweating, fluid in his right ear that he has been able to feel moving for the last week, and sore throat for a few days. Uses Claritin and Flonase as needed for allergies. Also complains of abdominal pain for several months that is worse with eating. He had an [place] in [place] within the last year. Pt denies vertigo, fever, headache, new extremity weakness, chest pain, and new SOB (pt notes chronic difficulty associated with asthma). Last stress test was 2 years ago. No recent medication change.  Past Medical History  Diagnosis Date  . Asthma   . Degenerative disk disease   . Diverticulitis   . Gastric ulcer   . Elevated blood pressure 03/02/13    (x) 2 years. Referral to cardiology  . SOB (shortness of breath)   . Chronic back pain     On celebrex  . Chronic neck pain     On celebrex  . Midsternal chest pain     Has had for a while, which he contributed to hiatal  hernia and peptic disease. No radiation, not sure about duration, not associated with diaphoresis.  . Dizziness     occational  . DOE (dyspnea on exertion)     occasional. Now can also feel when he is at rest. Former smoker.  . Peptic disease   . Hiatal hernia   . Chest discomfort     Could be related to hiatal hernia but because of Hx of HTN, HLD and + FHX, an exercise stress test & referral to cardiology will be arranged.  . Esophageal reflux   . Benign essential hypertension     Low salt diet. Systolic goal <425, diastolic goal <95.  Marland Kitchen Hyperlipidemia     Last FLP in 2012 slightly elevated. LDL goal <100.  Marland Kitchen Allergic rhinitis, cause unspecified   . Cervicalgia   . Pain in joint, site unspecified   . Sleep disturbance, unspecified   . Chronic insomnia   . Abdominal pain   . Dyspepsia   . Diarrhea   . Blood in stool   . Tremor     Per Dr. [name]  . ENT complaint     Per Dr. Lucia Gaskins  . Diverticulosis   . Carpal tunnel syndrome, bilateral   . HOH (hard of hearing)   . Sleep apnea syndrome     was to try cpap-did not get one-repeat studies  could not sleep    Past Surgical History  Procedure Laterality Date  . Appendectomy    . Nasal septum surgery    . Colonoscopy    . Upper gi endoscopy    . Carpal tunnel release  4/14    rt  . Carpal tunnel release Left 05/17/2013    Procedure: LEFT CARPAL TUNNEL RELEASE;  Surgeon: Cammie Sickle., MD;  Location: [facility];  Service: Orthopedics;  Laterality: Left;    Family History  Problem Relation Age of Onset  . CAD    . Heart attack Father     Deceased, 44  . CAD Brother   . Hypertension Brother   . Hyperlipidemia Brother   . Alzheimer's disease Mother     Deceased, 18  . Hyperlipidemia Sister   . Hypertension Sister     Social History  Substance Use Topics  . Smoking status: Former Smoker -- 0.75 packs/day for 15 years    Types: Cigarettes    Quit date: 03/25/1991  . Smokeless tobacco: Never Used  .  Alcohol Use: 1.2 oz/week    1 Glasses of wine, 1 Cans of beer per week     Comment: 1-2 drinks per night     Review of Systems  Constitutional: Negative for fever, chills, diaphoresis and fatigue.  HENT: Negative for congestion, rhinorrhea and sneezing.   Eyes: Negative.   Respiratory: Negative for cough, chest tightness and shortness of breath.   Cardiovascular: Negative for chest pain and leg swelling.  Gastrointestinal: Positive for abdominal pain. Negative for nausea, vomiting, diarrhea and blood in stool.  Genitourinary: Negative for frequency, hematuria, flank pain and difficulty urinating.  Musculoskeletal: Negative for back pain and arthralgias.  Skin: Negative for rash.  Neurological: Positive for light-headedness. Negative for dizziness, speech difficulty, weakness, numbness and headaches.     Home Medications   Prior to Admission medications   Medication Sig Start Date End Date Taking? Authorizing Provider  albuterol (PROVENTIL HFA;VENTOLIN HFA) 108 (90 BASE) MCG/ACT inhaler Inhale 2 puffs into the lungs at bedtime as needed for wheezing or shortness of breath (cough).    Historical Provider, MD  aspirin EC 81 MG tablet Take 81 mg by mouth daily.    Historical Provider, MD  atorvastatin (LIPITOR) 20 MG tablet Take 20 mg by mouth at bedtime.     Historical Provider, MD  celecoxib (CELEBREX) 200 MG capsule Take 200 mg by mouth daily.    Historical Provider, MD  cholecalciferol (VITAMIN D) 1000 UNITS tablet Take 1,000 Units by mouth daily.    Historical Provider, MD  cimetidine (TAGAMET) 200 MG tablet Take 200 mg by mouth at bedtime.    Historical Provider, MD  cyclobenzaprine (FLEXERIL) 10 MG tablet Take 10 mg by mouth at bedtime. For back pain    Historical Provider, MD  esomeprazole (NEXIUM) 40 MG capsule Take 40 mg by mouth daily.     Historical Provider, MD  fluticasone (FLONASE) 50 MCG/ACT nasal spray Place 1 spray into both nostrils daily as needed for allergies or  rhinitis (congestion).    Historical Provider, MD  gabapentin (NEURONTIN) 400 MG capsule Take 800 mg by mouth 2 (two) times daily.    Historical Provider, MD  GLUCOS-CHONDROIT-HYALURON-MSM PO Take 1 tablet by mouth 2 (two) times daily. Glucosamine 1500 with chondroiton, hyaluronic acid, msm    Historical Provider, MD  HYDROcodone-acetaminophen (NORCO/VICODIN) 5-325 MG per tablet Take 1 tablet by mouth See admin instructions. Take 1 tablet by mouth  daily at bedtime and in the middle of the night, may take an additional tablet during the day as needed for pain    Historical Provider, MD  KRILL OIL PO Take 350 mg by mouth daily.    Historical Provider, MD  lisinopril-hydrochlorothiazide (PRINZIDE,ZESTORETIC) 10-12.5 MG per tablet Take 1 tablet by mouth daily.    Historical Provider, MD  loratadine (CLARITIN) 10 MG tablet Take 10 mg by mouth daily.     Historical Provider, MD  Lutein 20 MG CAPS Take 20 mg by mouth daily.    Historical Provider, MD  Probiotic Product (ALIGN) 4 MG CAPS Take 4 mg by mouth daily.     Historical Provider, MD  tamsulosin (FLOMAX) 0.4 MG CAPS capsule Take 0.4 mg by mouth daily with breakfast. 10/31/14   Historical Provider, MD  traZODone (DESYREL) 150 MG tablet Take 150 mg by mouth at bedtime. For sleep    Historical Provider, MD  zolpidem (AMBIEN) 10 MG tablet Take 10 mg by mouth at bedtime.    Historical Provider, MD    Allergies  Review of patient's allergies indicates no known allergies.  Triage Vitals: BP 132/81 mmHg  Pulse 79  Temp(Src) 98.1 F (36.7 C) (Oral)  Resp 16  Ht 5\' 11"  (1.803 m)  Wt 230 lb (104.327 kg)  BMI 32.09 kg/m2  SpO2 98%  Physical Exam  Constitutional: He is oriented to person, place, and time. He appears well-developed and well-nourished.  HENT:  Head: Normocephalic and atraumatic.  Eyes: Pupils are equal, round, and reactive to light.  Neck: Normal range of motion. Neck supple.  Cardiovascular: Normal rate, regular rhythm and normal  heart sounds.   Pulmonary/Chest: Effort normal and breath sounds normal. No respiratory distress. He has no wheezes. He has no rales. He exhibits no tenderness.  Abdominal: Soft. Bowel sounds are normal. There is no tenderness. There is no rebound and no guarding.  Musculoskeletal: Normal range of motion. He exhibits no edema.  Lymphadenopathy:    He has no cervical adenopathy.  Neurological: He is alert and oriented to person, place, and time. He has normal strength. No cranial nerve deficit or sensory deficit. GCS eye subscore is 4. GCS verbal subscore is 5. GCS motor subscore is 6.  FTN intact, no pronator drift  Skin: Skin is warm and dry. No rash noted.  Psychiatric: He has a normal mood and affect.     ED Course  Procedures   DIAGNOSTIC STUDIES: Oxygen Saturation is 98% on RA, normal by my interpretation.    COORDINATION OF CARE: 6:26 PM Discussed treatment plan which includes lab work and EKG with pt at bedside and pt agreed to plan.  I, Malvin Johns, MD, personally reviewed and evaluated these images and lab results as part of my medical decision-making.  Results for orders placed or performed during the hospital encounter of 11/10/14  Rapid strep screen  Result Value Ref Range   Streptococcus, Group A Screen (Direct) NEGATIVE NEGATIVE  Comprehensive metabolic panel  Result Value Ref Range   Sodium 140 135 - 145 mmol/L   Potassium 3.6 3.5 - 5.1 mmol/L   Chloride 102 101 - 111 mmol/L   CO2 31 22 - 32 mmol/L   Glucose, Bld 93 65 - 99 mg/dL   BUN 21 (H) 6 - 20 mg/dL   Creatinine, Ser 0.87 0.61 - 1.24 mg/dL   Calcium 9.3 8.9 - 10.3 mg/dL   Total Protein 7.4 6.5 - 8.1 g/dL   Albumin 4.3 3.5 -  5.0 g/dL   AST 26 15 - 41 U/L   ALT 21 17 - 63 U/L   Alkaline Phosphatase 61 38 - 126 U/L   Total Bilirubin 0.7 0.3 - 1.2 mg/dL   GFR calc non Af Amer >60 >60 mL/min   GFR calc Af Amer >60 >60 mL/min   Anion gap 7 5 - 15  CBC with Differential  Result Value Ref Range   WBC  8.3 4.0 - 10.5 K/uL   RBC 4.80 4.22 - 5.81 MIL/uL   Hemoglobin 13.9 13.0 - 17.0 g/dL   HCT 42.3 39.0 - 52.0 %   MCV 88.1 78.0 - 100.0 fL   MCH 29.0 26.0 - 34.0 pg   MCHC 32.9 30.0 - 36.0 g/dL   RDW 13.2 11.5 - 15.5 %   Platelets 225 150 - 400 K/uL   Neutrophils Relative % 51 43 - 77 %   Neutro Abs 4.3 1.7 - 7.7 K/uL   Lymphocytes Relative 38 12 - 46 %   Lymphs Abs 3.2 0.7 - 4.0 K/uL   Monocytes Relative 8 3 - 12 %   Monocytes Absolute 0.6 0.1 - 1.0 K/uL   Eosinophils Relative 2 0 - 5 %   Eosinophils Absolute 0.1 0.0 - 0.7 K/uL   Basophils Relative 1 0 - 1 %   Basophils Absolute 0.0 0.0 - 0.1 K/uL  Lipase, blood  Result Value Ref Range   Lipase 35 22 - 51 U/L  Urinalysis, Routine w reflex microscopic (not at [facility])  Result Value Ref Range   Color, Urine YELLOW YELLOW   APPearance CLEAR CLEAR   Specific Gravity, Urine 1.028 1.005 - 1.030   pH 5.5 5.0 - 8.0   Glucose, UA NEGATIVE NEGATIVE mg/dL   Hgb urine dipstick TRACE (A) NEGATIVE   Bilirubin Urine NEGATIVE NEGATIVE   Ketones, ur NEGATIVE NEGATIVE mg/dL   Protein, ur NEGATIVE NEGATIVE mg/dL   Urobilinogen, UA 1.0 0.0 - 1.0 mg/dL   Nitrite NEGATIVE NEGATIVE   Leukocytes, UA NEGATIVE NEGATIVE  Urine microscopic-add on  Result Value Ref Range   Squamous Epithelial / LPF RARE RARE   RBC / HPF 0-2 <3 RBC/hpf   Bacteria, UA RARE RARE   No results found.  Imaging Review No results found.  EKG Interpretation  Date/Time:  Friday November 10 2014 18:35:22 EDT Ventricular Rate:  69 PR Interval:  184 QRS Duration: 86 QT Interval:  434 QTC Calculation: 465 R Axis:   44 Text Interpretation:  Sinus rhythm with marked sinus arrhythmia Nonspecific ST abnormality Abnormal ECG since last tracing no significant change Confirmed by Michel Hendon  MD, Genell Thede ([id number]) on 11/10/2014 7:18:01 PM       MDM   Final diagnoses:  Dizziness   Patient presents with lightheadedness. He's had the similar symptoms for couple of years. He has no  associated chest pain or shortness of breath. He has no headache or neurologic deficits. He does not currently have an arrhythmia or evidence of bradycardia. His labs are unremarkable. He has no vertiginous-type symptoms. He does have some mild URI symptoms. I encouraged him to use his Flonase regularly which she is not doing now. He will also continue his Claritin. I encouraged him to have close follow-up with his primary care physician. He currently has no abdominal pain although he's had some symptoms that could be consistent with gallbladder disease. He has had an ultrasound within the last year that was negative for gallstones. I told him to discuss with his  PCP about a possible hiatus scan if his symptoms continue. Return precautions were given.  I personally performed the services described in this documentation, which was scribed in my presence.  The recorded information has been reviewed and considered.    Malvin Johns, MD 11/10/14 2006

## 2014-11-10 NOTE — ED Notes (Addendum)
Intermittent dizziness for months that seemed worse today.  States he also has a sore throat. States he was evaluated at [facility] and sent to ED for further evaluation.

## 2014-11-10 NOTE — Discharge Instructions (Signed)

## 2014-11-13 ENCOUNTER — Encounter (HOSPITAL_COMMUNITY)
Admission: RE | Admit: 2014-11-13 | Discharge: 2014-11-13 | Disposition: A | Discharge status: Home / Self Care | Payer: 59 | Source: Ambulatory Visit | Attending: Neurosurgery | Admitting: Neurosurgery

## 2014-11-13 ENCOUNTER — Encounter (HOSPITAL_COMMUNITY): Payer: Self-pay

## 2014-11-13 DIAGNOSIS — Z01812 Encounter for preprocedural laboratory examination: Secondary | ICD-10-CM | POA: Insufficient documentation

## 2014-11-13 DIAGNOSIS — G959 Disease of spinal cord, unspecified: Secondary | ICD-10-CM | POA: Insufficient documentation

## 2014-11-13 HISTORY — DX: Adverse effect of unspecified anesthetic, initial encounter: T41.45XA

## 2014-11-13 HISTORY — DX: Malignant (primary) neoplasm, unspecified: C80.1

## 2014-11-13 HISTORY — DX: Other complications of anesthesia, initial encounter: T88.59XA

## 2014-11-13 HISTORY — DX: Ventral hernia without obstruction or gangrene: K43.9

## 2014-11-13 HISTORY — DX: Unspecified disorder of eye and adnexa: H57.9

## 2014-11-13 LAB — SURGICAL PCR SCREEN
MRSA, PCR: NEGATIVE
STAPHYLOCOCCUS AUREUS: NEGATIVE

## 2014-11-13 LAB — CBC
HCT: 41.7 % (ref 39.0–52.0)
HEMOGLOBIN: 14 g/dL (ref 13.0–17.0)
MCH: 29.2 pg (ref 26.0–34.0)
MCHC: 33.6 g/dL (ref 30.0–36.0)
MCV: 87.1 fL (ref 78.0–100.0)
PLATELETS: 224 10*3/uL (ref 150–400)
RBC: 4.79 MIL/uL (ref 4.22–5.81)
RDW: 13 % (ref 11.5–15.5)
WBC: 8.6 10*3/uL (ref 4.0–10.5)

## 2014-11-13 LAB — BASIC METABOLIC PANEL
ANION GAP: 9 (ref 5–15)
BUN: 13 mg/dL (ref 6–20)
CALCIUM: 9.4 mg/dL (ref 8.9–10.3)
CO2: 28 mmol/L (ref 22–32)
CREATININE: 0.87 mg/dL (ref 0.61–1.24)
Chloride: 102 mmol/L (ref 101–111)
Glucose, Bld: 109 mg/dL — ABNORMAL HIGH (ref 65–99)
Potassium: 4 mmol/L (ref 3.5–5.1)
Sodium: 139 mmol/L (ref 135–145)

## 2014-11-13 LAB — CULTURE, GROUP A STREP: STREP A CULTURE: NEGATIVE

## 2014-11-13 NOTE — Progress Notes (Signed)
   11/13/14 1320  OBSTRUCTIVE SLEEP APNEA  Have you ever been diagnosed with sleep apnea through a sleep study? Yes  If yes, do you have and use a CPAP or BPAP machine every night? 0  Do you snore loudly (loud enough to be heard through closed doors)?  1  Do you often feel tired, fatigued, or sleepy during the daytime? 1  Has anyone observed you stop breathing during your sleep? 0  Do you have, or are you being treated for high blood pressure? 1  BMI more than 35 kg/m2? 0  Age over 62 years old? 1  Neck circumference greater than 40 cm/16 inches? 1  Gender: 1

## 2014-11-13 NOTE — Pre-Procedure Instructions (Signed)
    [name]  11/13/2014      [id number] - [place], [place] - [address] [place] [address] Phone: [phone] Fax: [phone]    Your procedure is scheduled on 11/24/2014  Report to [facility] Admitting at 8:00 A.M.  Call this number if you have problems the morning of surgery:  [phone]   Remember:  Do not eat food or drink liquids after midnight.  On THURSDAY  Take these medicines the morning of surgery with A SIP OF WATER Nexium, Gabapentin, Pain medicine ( if you need it) , Tamsulosin    Do not wear jewelry   Do not wear lotions, powders, or perfumes.  You may wear deodorant.   Do not shave 48 hours prior to surgery.  Men may shave face and neck.   Do not bring valuables to the hospital.   [facility] is not responsible for any belongings or valuables.  Contacts, dentures or bridgework may not be worn into surgery.  Leave your suitcase in the car.  After surgery it may be brought to your room.  For patients admitted to the hospital, discharge time will be determined by your treatment team.  Patients discharged the day of surgery will not be allowed to drive home.   Name and phone number of your driver:   With wife  Special instructions:    Please read over the following fact sheets that you were given. Pain Booklet, Coughing and Deep Breathing, MRSA Information and Surgical Site Infection Prevention

## 2014-11-13 NOTE — Progress Notes (Signed)
Pt. Reports that the stress echo that was done (2015)through PCP- Dr. Maceo Pro was for baseline due to vague complaints related to chest discomfort & also family hx. Was in ED w/i the past week for dizziness & thinks that his complaint is related to his gallbladder because his symptoms are mirroring what his family members have experienced with gall bladder problems.  Pt. Will follow up with PCP & will be having further testing to determine the concerns.

## 2014-11-13 NOTE — Progress Notes (Signed)
   11/13/14 1106  OBSTRUCTIVE SLEEP APNEA  Have you ever been diagnosed with sleep apnea through a sleep study? Yes (mild apnea , last study- 5-6 yrs. ago, never given CPAP)  If yes, do you have and use a CPAP or BPAP machine every night? 0  Do you snore loudly (loud enough to be heard through closed doors)?  1  Do you often feel tired, fatigued, or sleepy during the daytime? 1  Has anyone observed you stop breathing during your sleep? 0  Do you have, or are you being treated for high blood pressure? 1  BMI more than 35 kg/m2? 0  Age over 75 years old? 1  Neck circumference greater than 40 cm/16 inches? 1  Gender: 1

## 2014-11-23 MED ORDER — CEFAZOLIN SODIUM-DEXTROSE 2-3 GM-% IV SOLR
2.0000 g | INTRAVENOUS | Status: AC
  Administered 2014-11-24: 2 g via INTRAVENOUS
  Filled 2014-11-23: qty 50

## 2014-11-23 NOTE — Anesthesia Preprocedure Evaluation (Addendum)
Anesthesia Evaluation  Patient identified by MRN, date of birth, ID band Patient awake    Reviewed: Allergy & Precautions, NPO status , Patient's Chart, lab work & pertinent test results  Airway Mallampati: II   Neck ROM: Full    Dental  (+) Teeth Intact, Dental Advisory Given   Pulmonary shortness of breath, asthma , sleep apnea , former smoker (quit 1993 12 pack years),  breath sounds clear to auscultation        Cardiovascular hypertension, Pt. on medications Rhythm:Regular  ECHO 2015 EF 70%, EKG 10/2014 ST changes   Neuro/Psych    GI/Hepatic hiatal hernia, PUD, GERD-  Medicated,  Endo/Other    Renal/GU STONES     Musculoskeletal   Abdominal (+)  Abdomen: soft.    Peds  Hematology   Anesthesia Other Findings   Reproductive/Obstetrics                            Anesthesia Physical Anesthesia Plan  ASA: III  Anesthesia Plan: General   Post-op Pain Management:    Induction: Intravenous  Airway Management Planned: Oral ETT  Additional Equipment:   Intra-op Plan:   Post-operative Plan:   Informed Consent: I have reviewed the patients History and Physical, chart, labs and discussed the procedure including the risks, benefits and alternatives for the proposed anesthesia with the patient or authorized representative who has indicated his/her understanding and acceptance.   Dental advisory given  Plan Discussed with:   Anesthesia Plan Comments: (GLIDE available, multimodal pain RX)        Anesthesia Quick Evaluation

## 2014-11-24 ENCOUNTER — Inpatient Hospital Stay (HOSPITAL_COMMUNITY)
Admission: AD | Admit: 2014-11-24 | Discharge: 2014-11-25 | DRG: 473 | Disposition: A | Discharge status: Home / Self Care | Payer: 59 | Source: Ambulatory Visit | Attending: Neurosurgery | Admitting: Neurosurgery

## 2014-11-24 ENCOUNTER — Encounter (HOSPITAL_COMMUNITY): Payer: Self-pay | Admitting: *Deleted

## 2014-11-24 ENCOUNTER — Inpatient Hospital Stay (HOSPITAL_COMMUNITY): Payer: 59 | Admitting: Anesthesiology

## 2014-11-24 ENCOUNTER — Inpatient Hospital Stay (HOSPITAL_COMMUNITY): Payer: 59

## 2014-11-24 ENCOUNTER — Encounter (HOSPITAL_COMMUNITY)
Admission: AD | Disposition: A | Discharge status: Home / Self Care | Payer: Self-pay | Source: Ambulatory Visit | Attending: Neurosurgery

## 2014-11-24 DIAGNOSIS — Z419 Encounter for procedure for purposes other than remedying health state, unspecified: Secondary | ICD-10-CM

## 2014-11-24 DIAGNOSIS — M4802 Spinal stenosis, cervical region: Secondary | ICD-10-CM | POA: Diagnosis present

## 2014-11-24 DIAGNOSIS — M5012 Cervical disc disorder with radiculopathy, mid-cervical region: Secondary | ICD-10-CM | POA: Diagnosis present

## 2014-11-24 DIAGNOSIS — Z79899 Other long term (current) drug therapy: Secondary | ICD-10-CM

## 2014-11-24 DIAGNOSIS — M5002 Cervical disc disorder with myelopathy, mid-cervical region: Principal | ICD-10-CM | POA: Diagnosis present

## 2014-11-24 DIAGNOSIS — H3530 Unspecified macular degeneration: Secondary | ICD-10-CM | POA: Diagnosis present

## 2014-11-24 HISTORY — PX: ANTERIOR CERVICAL DECOMP/DISCECTOMY FUSION: SHX1161

## 2014-11-24 SURGERY — ANTERIOR CERVICAL DECOMPRESSION/DISCECTOMY FUSION 3 LEVELS
Anesthesia: General | Site: Spine Cervical

## 2014-11-24 MED ORDER — OXYCODONE-ACETAMINOPHEN 5-325 MG PO TABS
1.0000 | ORAL_TABLET | ORAL | Status: DC | PRN
  Administered 2014-11-24 – 2014-11-25 (×3): 2 via ORAL
  Filled 2014-11-24 (×3): qty 2

## 2014-11-24 MED ORDER — ONDANSETRON HCL 4 MG/2ML IJ SOLN
INTRAMUSCULAR | Status: AC
  Filled 2014-11-24: qty 2

## 2014-11-24 MED ORDER — SODIUM CHLORIDE 0.9 % IV SOLN: 250.0000 mL | INTRAVENOUS | Status: DC

## 2014-11-24 MED ORDER — BISACODYL 10 MG RE SUPP: 10.0000 mg | Freq: Every day | RECTAL | Status: DC | PRN

## 2014-11-24 MED ORDER — HYDROCODONE-ACETAMINOPHEN 5-325 MG PO TABS: 1.0000 | ORAL_TABLET | ORAL | Status: DC

## 2014-11-24 MED ORDER — ASPIRIN EC 81 MG PO TBEC
81.0000 mg | DELAYED_RELEASE_TABLET | Freq: Every day | ORAL | Status: DC
  Administered 2014-11-24: 81 mg via ORAL
  Filled 2014-11-24: qty 1

## 2014-11-24 MED ORDER — DEXAMETHASONE SODIUM PHOSPHATE 4 MG/ML IJ SOLN
INTRAMUSCULAR | Status: AC
  Filled 2014-11-24: qty 2

## 2014-11-24 MED ORDER — METHOCARBAMOL 1000 MG/10ML IJ SOLN
500.0000 mg | Freq: Four times a day (QID) | INTRAMUSCULAR | Status: DC | PRN
  Filled 2014-11-24: qty 5

## 2014-11-24 MED ORDER — ZOLPIDEM TARTRATE 5 MG PO TABS
10.0000 mg | ORAL_TABLET | Freq: Every day | ORAL | Status: DC
  Administered 2014-11-24: 10 mg via ORAL
  Filled 2014-11-24: qty 2

## 2014-11-24 MED ORDER — LISINOPRIL-HYDROCHLOROTHIAZIDE 10-12.5 MG PO TABS: 1.0000 | ORAL_TABLET | Freq: Every day | ORAL | Status: DC

## 2014-11-24 MED ORDER — ALUM & MAG HYDROXIDE-SIMETH 200-200-20 MG/5ML PO SUSP: 30.0000 mL | Freq: Four times a day (QID) | ORAL | Status: DC | PRN

## 2014-11-24 MED ORDER — SODIUM CHLORIDE 0.9 % IJ SOLN
3.0000 mL | Freq: Two times a day (BID) | INTRAMUSCULAR | Status: DC
Start: 2014-11-24 — End: 2014-11-25
  Administered 2014-11-24: 3 mL via INTRAVENOUS

## 2014-11-24 MED ORDER — TAMSULOSIN HCL 0.4 MG PO CAPS
0.4000 mg | ORAL_CAPSULE | Freq: Every day | ORAL | Status: DC
  Administered 2014-11-25: 0.4 mg via ORAL
  Filled 2014-11-24: qty 1

## 2014-11-24 MED ORDER — KCL IN DEXTROSE-NACL 20-5-0.45 MEQ/L-%-% IV SOLN
INTRAVENOUS | Status: DC
  Filled 2014-11-24 (×3): qty 1000

## 2014-11-24 MED ORDER — PROPOFOL 10 MG/ML IV BOLUS
INTRAVENOUS | Status: DC | PRN
  Administered 2014-11-24: 200 mg via INTRAVENOUS

## 2014-11-24 MED ORDER — MENTHOL 3 MG MT LOZG: 1.0000 | LOZENGE | OROMUCOSAL | Status: DC | PRN

## 2014-11-24 MED ORDER — ACETAMINOPHEN 325 MG PO TABS: 650.0000 mg | ORAL_TABLET | ORAL | Status: DC | PRN

## 2014-11-24 MED ORDER — CEFAZOLIN SODIUM-DEXTROSE 2-3 GM-% IV SOLR
2.0000 g | Freq: Three times a day (TID) | INTRAVENOUS | Status: AC
  Administered 2014-11-24 – 2014-11-25 (×2): 2 g via INTRAVENOUS
  Filled 2014-11-24 (×2): qty 50

## 2014-11-24 MED ORDER — DEXAMETHASONE SODIUM PHOSPHATE 4 MG/ML IJ SOLN
INTRAMUSCULAR | Status: DC | PRN
  Administered 2014-11-24: 2 mg via INTRAVENOUS
  Administered 2014-11-24: 8 mg via INTRAVENOUS

## 2014-11-24 MED ORDER — ONDANSETRON HCL 4 MG/2ML IJ SOLN
INTRAMUSCULAR | Status: DC | PRN
  Administered 2014-11-24: 4 mg via INTRAVENOUS

## 2014-11-24 MED ORDER — PHENYLEPHRINE HCL 10 MG/ML IJ SOLN
INTRAMUSCULAR | Status: DC | PRN
  Administered 2014-11-24: 40 ug via INTRAVENOUS
  Administered 2014-11-24 (×3): 80 ug via INTRAVENOUS

## 2014-11-24 MED ORDER — MIDAZOLAM HCL 5 MG/5ML IJ SOLN
INTRAMUSCULAR | Status: DC | PRN
  Administered 2014-11-24: 2 mg via INTRAVENOUS

## 2014-11-24 MED ORDER — ROCURONIUM BROMIDE 50 MG/5ML IV SOLN
INTRAVENOUS | Status: AC
  Filled 2014-11-24: qty 1

## 2014-11-24 MED ORDER — THROMBIN 5000 UNITS EX SOLR
OROMUCOSAL | Status: DC | PRN
  Administered 2014-11-24: 13:00:00 via TOPICAL

## 2014-11-24 MED ORDER — LORATADINE 10 MG PO TABS
10.0000 mg | ORAL_TABLET | Freq: Every day | ORAL | Status: DC
  Administered 2014-11-24: 10 mg via ORAL
  Filled 2014-11-24: qty 1

## 2014-11-24 MED ORDER — LUTEIN 20 MG PO CAPS: 20.0000 mg | ORAL_CAPSULE | Freq: Every day | ORAL | Status: DC

## 2014-11-24 MED ORDER — HYDROMORPHONE HCL 1 MG/ML IJ SOLN
INTRAMUSCULAR | Status: AC
  Filled 2014-11-24: qty 1

## 2014-11-24 MED ORDER — ONDANSETRON HCL 4 MG/2ML IJ SOLN: 4.0000 mg | INTRAMUSCULAR | Status: DC | PRN

## 2014-11-24 MED ORDER — FENTANYL CITRATE (PF) 100 MCG/2ML IJ SOLN
INTRAMUSCULAR | Status: DC | PRN
  Administered 2014-11-24: 100 ug via INTRAVENOUS
  Administered 2014-11-24 (×2): 50 ug via INTRAVENOUS

## 2014-11-24 MED ORDER — FAMOTIDINE 20 MG PO TABS
20.0000 mg | ORAL_TABLET | Freq: Two times a day (BID) | ORAL | Status: DC
  Administered 2014-11-24: 20 mg via ORAL
  Filled 2014-11-24: qty 1

## 2014-11-24 MED ORDER — PANTOPRAZOLE SODIUM 40 MG IV SOLR: 40.0000 mg | Freq: Every day | INTRAVENOUS | Status: DC

## 2014-11-24 MED ORDER — PHENYLEPHRINE 40 MCG/ML (10ML) SYRINGE FOR IV PUSH (FOR BLOOD PRESSURE SUPPORT)
PREFILLED_SYRINGE | INTRAVENOUS | Status: AC
  Filled 2014-11-24: qty 10

## 2014-11-24 MED ORDER — DOCUSATE SODIUM 100 MG PO CAPS
100.0000 mg | ORAL_CAPSULE | Freq: Two times a day (BID) | ORAL | Status: DC
  Administered 2014-11-24: 100 mg via ORAL
  Filled 2014-11-24: qty 1

## 2014-11-24 MED ORDER — MIDAZOLAM HCL 2 MG/2ML IJ SOLN
INTRAMUSCULAR | Status: AC
  Filled 2014-11-24: qty 4

## 2014-11-24 MED ORDER — POLYETHYLENE GLYCOL 3350 17 G PO PACK: 17.0000 g | PACK | Freq: Every day | ORAL | Status: DC | PRN

## 2014-11-24 MED ORDER — SACCHAROMYCES BOULARDII 250 MG PO CAPS
250.0000 mg | ORAL_CAPSULE | Freq: Every day | ORAL | Status: DC
  Administered 2014-11-24: 250 mg via ORAL
  Filled 2014-11-24 (×2): qty 1

## 2014-11-24 MED ORDER — SUGAMMADEX SODIUM 200 MG/2ML IV SOLN
INTRAVENOUS | Status: AC
  Filled 2014-11-24: qty 2

## 2014-11-24 MED ORDER — PANTOPRAZOLE SODIUM 40 MG PO TBEC: 40.0000 mg | DELAYED_RELEASE_TABLET | Freq: Every day | ORAL | Status: DC

## 2014-11-24 MED ORDER — VITAMIN D 1000 UNITS PO TABS
1000.0000 [IU] | ORAL_TABLET | Freq: Every day | ORAL | Status: DC
  Administered 2014-11-24: 1000 [IU] via ORAL
  Filled 2014-11-24: qty 1

## 2014-11-24 MED ORDER — LIDOCAINE HCL (CARDIAC) 20 MG/ML IV SOLN
INTRAVENOUS | Status: DC | PRN
  Administered 2014-11-24: 100 mg via INTRAVENOUS

## 2014-11-24 MED ORDER — MEPERIDINE HCL 25 MG/ML IJ SOLN: 6.2500 mg | INTRAMUSCULAR | Status: DC | PRN

## 2014-11-24 MED ORDER — ALBUTEROL SULFATE (2.5 MG/3ML) 0.083% IN NEBU: 3.0000 mL | INHALATION_SOLUTION | Freq: Every evening | RESPIRATORY_TRACT | Status: DC | PRN

## 2014-11-24 MED ORDER — HYDROMORPHONE HCL 1 MG/ML IJ SOLN: 0.5000 mg | INTRAMUSCULAR | Status: DC | PRN

## 2014-11-24 MED ORDER — FENTANYL CITRATE (PF) 250 MCG/5ML IJ SOLN
INTRAMUSCULAR | Status: AC
  Filled 2014-11-24: qty 5

## 2014-11-24 MED ORDER — DEXAMETHASONE SODIUM PHOSPHATE 4 MG/ML IJ SOLN
INTRAMUSCULAR | Status: AC
  Filled 2014-11-24: qty 1

## 2014-11-24 MED ORDER — LACTATED RINGERS IV SOLN
INTRAVENOUS | Status: DC
Start: 2014-11-24 — End: 2014-11-24
  Administered 2014-11-24 (×2): via INTRAVENOUS

## 2014-11-24 MED ORDER — HYDROCODONE-ACETAMINOPHEN 5-325 MG PO TABS: 1.0000 | ORAL_TABLET | ORAL | Status: DC | PRN

## 2014-11-24 MED ORDER — TRAZODONE HCL 150 MG PO TABS
150.0000 mg | ORAL_TABLET | Freq: Every day | ORAL | Status: DC
  Administered 2014-11-24: 150 mg via ORAL
  Filled 2014-11-24 (×2): qty 1

## 2014-11-24 MED ORDER — PROMETHAZINE HCL 25 MG/ML IJ SOLN: 6.2500 mg | INTRAMUSCULAR | Status: DC | PRN

## 2014-11-24 MED ORDER — SUGAMMADEX SODIUM 500 MG/5ML IV SOLN
INTRAVENOUS | Status: DC | PRN
  Administered 2014-11-24: 200 mg via INTRAVENOUS

## 2014-11-24 MED ORDER — ATORVASTATIN CALCIUM 20 MG PO TABS
20.0000 mg | ORAL_TABLET | Freq: Every day | ORAL | Status: DC
  Administered 2014-11-24: 20 mg via ORAL
  Filled 2014-11-24: qty 1

## 2014-11-24 MED ORDER — FLUTICASONE PROPIONATE 50 MCG/ACT NA SUSP: 1.0000 | Freq: Every day | NASAL | Status: DC | PRN

## 2014-11-24 MED ORDER — PROPOFOL 10 MG/ML IV BOLUS
INTRAVENOUS | Status: AC
  Filled 2014-11-24: qty 20

## 2014-11-24 MED ORDER — SODIUM CHLORIDE 0.9 % IJ SOLN: 3.0000 mL | INTRAMUSCULAR | Status: DC | PRN

## 2014-11-24 MED ORDER — LIDOCAINE-EPINEPHRINE 1 %-1:100000 IJ SOLN
INTRAMUSCULAR | Status: DC | PRN
  Administered 2014-11-24: 5 mL

## 2014-11-24 MED ORDER — METHOCARBAMOL 500 MG PO TABS
500.0000 mg | ORAL_TABLET | Freq: Four times a day (QID) | ORAL | Status: DC | PRN
  Filled 2014-11-24: qty 1

## 2014-11-24 MED ORDER — THROMBIN 20000 UNITS EX SOLR
CUTANEOUS | Status: DC | PRN
  Administered 2014-11-24: 13:00:00 via TOPICAL

## 2014-11-24 MED ORDER — PHENOL 1.4 % MT LIQD: 1.0000 | OROMUCOSAL | Status: DC | PRN

## 2014-11-24 MED ORDER — HYDROCHLOROTHIAZIDE 12.5 MG PO CAPS
12.5000 mg | ORAL_CAPSULE | Freq: Every day | ORAL | Status: DC
  Administered 2014-11-24: 12.5 mg via ORAL
  Filled 2014-11-24: qty 1

## 2014-11-24 MED ORDER — BUPIVACAINE HCL (PF) 0.5 % IJ SOLN
INTRAMUSCULAR | Status: DC | PRN
  Administered 2014-11-24: 5 mL

## 2014-11-24 MED ORDER — ROCURONIUM BROMIDE 100 MG/10ML IV SOLN
INTRAVENOUS | Status: DC | PRN
  Administered 2014-11-24: 40 mg via INTRAVENOUS

## 2014-11-24 MED ORDER — CYCLOBENZAPRINE HCL 10 MG PO TABS
10.0000 mg | ORAL_TABLET | Freq: Every day | ORAL | Status: DC
  Administered 2014-11-24: 10 mg via ORAL
  Filled 2014-11-24: qty 1

## 2014-11-24 MED ORDER — HYDROMORPHONE HCL 1 MG/ML IJ SOLN
0.2500 mg | INTRAMUSCULAR | Status: DC | PRN
  Administered 2014-11-24 (×2): 0.5 mg via INTRAVENOUS

## 2014-11-24 MED ORDER — LIDOCAINE HCL (CARDIAC) 20 MG/ML IV SOLN
INTRAVENOUS | Status: AC
  Filled 2014-11-24: qty 5

## 2014-11-24 MED ORDER — GABAPENTIN 400 MG PO CAPS
800.0000 mg | ORAL_CAPSULE | Freq: Two times a day (BID) | ORAL | Status: DC
  Administered 2014-11-24: 800 mg via ORAL
  Filled 2014-11-24: qty 2

## 2014-11-24 MED ORDER — LISINOPRIL 20 MG PO TABS
10.0000 mg | ORAL_TABLET | Freq: Every day | ORAL | Status: DC
  Administered 2014-11-24: 10 mg via ORAL
  Filled 2014-11-24: qty 1

## 2014-11-24 MED ORDER — ACETAMINOPHEN 650 MG RE SUPP: 650.0000 mg | RECTAL | Status: DC | PRN

## 2014-11-24 MED ORDER — FLEET ENEMA 7-19 GM/118ML RE ENEM: 1.0000 | ENEMA | Freq: Once | RECTAL | Status: DC | PRN

## 2014-11-24 MED ORDER — SODIUM CHLORIDE 0.9 % IV SOLN
10.0000 mg | INTRAVENOUS | Status: DC | PRN
  Administered 2014-11-24: 10 ug/min via INTRAVENOUS

## 2014-11-24 SURGICAL SUPPLY — 73 items
ADH SKN CLS APL DERMABOND .7 (GAUZE/BANDAGES/DRESSINGS) ×1
APL SKNCLS STERI-STRIP NONHPOA (GAUZE/BANDAGES/DRESSINGS)
BENZOIN TINCTURE PRP APPL 2/3 (GAUZE/BANDAGES/DRESSINGS) IMPLANT
BIT DRILL ARCHON 2.5X13MM DTS (DRILL) ×1 IMPLANT
BIT DRILL NEURO 2X3.1 SFT TUCH (MISCELLANEOUS) ×1 IMPLANT
BLADE ULTRA TIP 2M (BLADE) IMPLANT
BNDG GAUZE ELAST 4 BULKY (GAUZE/BANDAGES/DRESSINGS) IMPLANT
BUR BARREL STRAIGHT FLUTE 4.0 (BURR) ×2 IMPLANT
CAGE COROENT SM 6X13X15 (Cage) ×2 IMPLANT
CAGE LORDOTIC 8 SM PLUS (Cage) ×2 IMPLANT
CAGE SMALL 7X13X15 (Cage) ×2 IMPLANT
CANISTER SUCT 3000ML PPV (MISCELLANEOUS) ×2 IMPLANT
COVER MAYO STAND STRL (DRAPES) ×2 IMPLANT
DECANTER SPIKE VIAL GLASS SM (MISCELLANEOUS) ×2 IMPLANT
DERMABOND ADVANCED (GAUZE/BANDAGES/DRESSINGS) ×1
DERMABOND ADVANCED .7 DNX12 (GAUZE/BANDAGES/DRESSINGS) ×1 IMPLANT
DRAIN JACKSON PRATT 10MM FLAT (MISCELLANEOUS) ×1 IMPLANT
DRAPE LAPAROTOMY 100X72 PEDS (DRAPES) ×2 IMPLANT
DRAPE MICROSCOPE LEICA (MISCELLANEOUS) ×2 IMPLANT
DRAPE POUCH INSTRU U-SHP 10X18 (DRAPES) ×2 IMPLANT
DRAPE PROXIMA HALF (DRAPES) IMPLANT
DRILL ARCHON 2.5X13MM DTS (DRILL) ×2
DRILL NEURO 2X3.1 SOFT TOUCH (MISCELLANEOUS) ×2
DRSG OPSITE POSTOP 3X4 (GAUZE/BANDAGES/DRESSINGS) ×1 IMPLANT
DURAPREP 6ML APPLICATOR 50/CS (WOUND CARE) ×2 IMPLANT
ELECT COATED BLADE 2.86 ST (ELECTRODE) ×2 IMPLANT
ELECT REM PT RETURN 9FT ADLT (ELECTROSURGICAL) ×2
ELECTRODE REM PT RTRN 9FT ADLT (ELECTROSURGICAL) ×1 IMPLANT
EVACUATOR SILICONE 100CC (DRAIN) ×1 IMPLANT
GAUZE SPONGE 4X4 12PLY STRL (GAUZE/BANDAGES/DRESSINGS) IMPLANT
GAUZE SPONGE 4X4 16PLY XRAY LF (GAUZE/BANDAGES/DRESSINGS) IMPLANT
GLOVE BIO SURGEON STRL SZ8 (GLOVE) ×2 IMPLANT
GLOVE BIOGEL PI IND STRL 8 (GLOVE) ×1 IMPLANT
GLOVE BIOGEL PI IND STRL 8.5 (GLOVE) ×1 IMPLANT
GLOVE BIOGEL PI INDICATOR 8 (GLOVE) ×1
GLOVE BIOGEL PI INDICATOR 8.5 (GLOVE) ×1
GLOVE ECLIPSE 8.0 STRL XLNG CF (GLOVE) ×2 IMPLANT
GLOVE EXAM NITRILE LRG STRL (GLOVE) IMPLANT
GLOVE EXAM NITRILE MD LF STRL (GLOVE) IMPLANT
GLOVE EXAM NITRILE XL STR (GLOVE) IMPLANT
GLOVE EXAM NITRILE XS STR PU (GLOVE) IMPLANT
GOWN STRL REUS W/ TWL LRG LVL3 (GOWN DISPOSABLE) IMPLANT
GOWN STRL REUS W/ TWL XL LVL3 (GOWN DISPOSABLE) ×1 IMPLANT
GOWN STRL REUS W/TWL 2XL LVL3 (GOWN DISPOSABLE) ×2 IMPLANT
GOWN STRL REUS W/TWL LRG LVL3 (GOWN DISPOSABLE)
GOWN STRL REUS W/TWL XL LVL3 (GOWN DISPOSABLE) ×2
HALTER HD/CHIN CERV TRACTION D (MISCELLANEOUS) ×2 IMPLANT
KIT BASIN OR (CUSTOM PROCEDURE TRAY) ×2 IMPLANT
KIT ROOM TURNOVER OR (KITS) ×2 IMPLANT
LIQUID BAND (GAUZE/BANDAGES/DRESSINGS) ×2 IMPLANT
NDL HYPO 25X1 1.5 SAFETY (NEEDLE) ×1 IMPLANT
NEEDLE HYPO 25X1 1.5 SAFETY (NEEDLE) ×2 IMPLANT
NEEDLE SPNL 18GX3.5 QUINCKE PK (NEEDLE) IMPLANT
NEEDLE SPNL 22GX3.5 QUINCKE BK (NEEDLE) ×2 IMPLANT
NS IRRIG 1000ML POUR BTL (IV SOLUTION) ×2 IMPLANT
PACK LAMINECTOMY NEURO (CUSTOM PROCEDURE TRAY) ×2 IMPLANT
PAD ARMBOARD 7.5X6 YLW CONV (MISCELLANEOUS) ×6 IMPLANT
PIN DISTRACTION 14MM (PIN) ×6 IMPLANT
PLATE ARCHON 3-LEVEL 58MM (Plate) ×1 IMPLANT
PUTTY STIMUBLAST 2.5CC (Putty) ×2 IMPLANT
RUBBERBAND STERILE (MISCELLANEOUS) ×4 IMPLANT
SCREW ARCHON SELFTAP 4.0X13 (Screw) ×16 IMPLANT
SPONGE INTESTINAL PEANUT (DISPOSABLE) ×2 IMPLANT
SPONGE SURGIFOAM ABS GEL 100 (HEMOSTASIS) ×2 IMPLANT
STAPLER SKIN PROX WIDE 3.9 (STAPLE) IMPLANT
STRIP CLOSURE SKIN 1/2X4 (GAUZE/BANDAGES/DRESSINGS) IMPLANT
SUT VIC AB 3-0 SH 8-18 (SUTURE) ×2 IMPLANT
SYR 5ML LL (SYRINGE) IMPLANT
TOWEL OR 17X24 6PK STRL BLUE (TOWEL DISPOSABLE) ×2 IMPLANT
TOWEL OR 17X26 10 PK STRL BLUE (TOWEL DISPOSABLE) ×2 IMPLANT
TRAP SPECIMEN MUCOUS 40CC (MISCELLANEOUS) ×1 IMPLANT
TRAY FOLEY W/METER SILVER 14FR (SET/KITS/TRAYS/PACK) IMPLANT
WATER STERILE IRR 1000ML POUR (IV SOLUTION) ×2 IMPLANT

## 2014-11-24 NOTE — Op Note (Signed)
11/24/2014  1:56 PM  PATIENT:  [name]  64 y.o. male  PRE-OPERATIVE DIAGNOSIS:  Cervical myelopathy with herniated cervical disc and cervical stenosis with cervical radiculopathy C 45, C 56, C 67 levels  POST-OPERATIVE DIAGNOSIS:  Cervical myelopathy with herniated cervical disc and cervical stenosis with cervical radiculopathy C 45, C 56, C 67 levels   PROCEDURE:  Procedure(s) with comments: Cervical 4-5 Cervical 5-6 Cervical 6-7 Anterior cervical decompression/diskectomy/fusion (N/A) - C4-5 C5-6 C6-7 Anterior cervical decompression/diskectomy/fusion with PEEK cages, autograft, allograft, plate  SURGEON:  Surgeon(s) and Role:    * Erline Levine, MD - Primary    * Karie Chimera, MD - Assisting  PHYSICIAN ASSISTANT:   ASSISTANTS: [place], RN   ANESTHESIA:   general  EBL:  Total I/O In: 1000 [I.V.:1000] Out: -   BLOOD ADMINISTERED:none  DRAINS: (10) Jackson-Pratt drain(s) with closed bulb suction in the prevertebral space   LOCAL MEDICATIONS USED:  LIDOCAINE   SPECIMEN:  No Specimen  DISPOSITION OF SPECIMEN:  N/A  COUNTS:  YES  TOURNIQUET:  * No tourniquets in log *  DICTATION: Patient was brought to operating room and following the smooth and uncomplicated induction of general endotracheal anesthesia his head was placed on a horseshoe head holder he was placed in 5 pounds of Holter traction and his anterior neck was prepped and draped in usual sterile fashion. An incision was made on the left side of midline after infiltrating the skin and subcutaneous tissues with local lidocaine. The platysmal layer was incised and subplatysmal dissection was performed exposing the anterior border sternocleidomastoid muscle. Using blunt dissection the carotid sheath was kept lateral and trachea and esophagus kept medial exposing the anterior cervical spine. A bent spinal needle was placed it was felt to be the C 34  level and this was confirmed on intraoperative x-ray. Longus coli  muscles were taken down from the anterior cervical spine using electrocautery and key elevator and self-retaining retractor was placed. The interspaces at C 45 and C 56 were incised and a thorough discectomy was performed. Distraction pins were placed. Uncinate spurs and central spondylitic ridges were drilled down with a high-speed drill. The spinal cord dura and both C6 nerve roots were widely decompressed. Hemostasis was assured. After trial sizing an 8 mm peek interbody cage was selected and packed with DBM and local autograft. The graft was tamped into position and countersunk appropriately. The retractor was moved and the interspace at C 45 was incised and a thorough discectomy was performed. Distraction pins were placed. Uncinate spurs and central spondylitic ridges were drilled down with a high-speed drill. The spinal cord dura and both C5 nerve roots were widely decompressed. Hemostasis was assured. After trial sizing a 7 mm peek interbody cage was selected and packed with profuse block and autograft. The graft was tamped into position and countersunk appropriately.The interspace at C 67 was incised and a thorough discectomy was performed. Distraction pins were placed. Uncinate spurs and central spondylitic ridges were drilled down with a high-speed drill. The spinal cord dura and both C 7 nerve roots were widely decompressed. A large fragment of herniated disc was removed on the right with decompression of the right C 7 nerve root. Hemostasis was assured. After trial sizing an 8 mm peek interbody cage was selected and packed in a similar fashion. The graft was tamped into position and countersunk appropriately.  Distraction weight was removed. A 58 mm Archon Nuvasive anterior cervical plate was affixed to the cervical spine with  13 mm variable-angle screws 2 at C4, 2 at C5, 2 at C6,  and 2 at C7. All screws were well-positioned and locking mechanisms were engaged. Soft tissues were inspected and found to  be in good repair. The wound was irrigated. A final x-ray was obtained with good visualization at C4 with the interbody graft well visualized. A #10 JP drain was inserted through a separate incision.  The platysma layer was closed with 3-0 Vicryl stitches and the skin was reapproximated with 3-0 Vicryl subcuticular stitches. The wound was dressed with Dermabond and an occlusive dressing. Counts were correct at the end of the case. Patient was extubated and taken to recovery in stable and satisfactory condition.    PLAN OF CARE: Admit to inpatient   PATIENT DISPOSITION:  PACU - hemodynamically stable.   Delay start of Pharmacological VTE agent (>24hrs) due to surgical blood loss or risk of bleeding: yes

## 2014-11-24 NOTE — Progress Notes (Signed)
Orthopedic Tech Progress Note Patient Details:  [name] Aug 18, 1950 [id number]  Ortho Devices Type of Ortho Device: Soft collar Ortho Device/Splint Interventions: Application   Cammer, Theodoro Parma 11/24/2014, 3:50 PM

## 2014-11-24 NOTE — Interval H&P Note (Signed)
History and Physical Interval Note:  11/24/2014 11:02 AM  [name]  has presented today for surgery, with the diagnosis of Cervical myelopathy  The various methods of treatment have been discussed with the patient and family. After consideration of risks, benefits and other options for treatment, the patient has consented to  Procedure(s) with comments: C4-5 C5-6 C6-7 Anterior cervical decompression/diskectomy/fusion (N/A) - C4-5 C5-6 C6-7 Anterior cervical decompression/diskectomy/fusion as a surgical intervention .  The patient's history has been reviewed, patient examined, no change in status, stable for surgery.  I have reviewed the patient's chart and labs.  Questions were answered to the patient's satisfaction.     Antonina Deziel D

## 2014-11-24 NOTE — H&P (Signed)
Patient ID:   [id number] Patient: [name]  Date of Birth: [birth year] Visit Type: Office Visit   Date: 09/27/2014 03:00 PM Provider: Marchia Meiers. Vertell Limber MD   This 64 year old male presents for neck pain.  History of Present Illness: 1.  neck pain  The patient's MRI with him which shows significant spinal stenosis most marked at the C5 C6 level at which she has a 6-7 mm spinal canal.  The spinal canal was also reduced with cord compression at C4 C5 and C6 C7 levels.  This is concerning given that the patient has a myelopathy on examination.  We lengthy discussion about his comorbidities as he is having trouble with his vision on the right and also is noticing increasing problems with Parkinson's disease.  Nonetheless he is concerned about his weakness and pain particularly in his right arm and wants to have this treated.  I explained to him that this would require surgery and this would involve anterior cervical decompression and fusion of the C4 C5, C5 C6, C6 C7 levels.  The patient wishes to discuss this with his wife and is going to call to arrange this if he wishes to proceed with surgery.  He is concerned with regard to his vision because he is having trouble with his right eye but also is developing left eye macular degeneration      Medical/Surgical/Interim History Reviewed, no change.  Last detailed document date:01/11/2014.   PAST MEDICAL HISTORY, SURGICAL HISTORY, FAMILY HISTORY, SOCIAL HISTORY AND REVIEW OF SYSTEMS I have reviewed the patient's past medical, surgical, family and social history as well as the comprehensive review of systems as included on the [facility] history form dated 01/11/2014, which I have signed.  Family History: Reviewed, no changes.  Last detailed document: 01/11/2014.   Social History: Tobacco use reviewed. Reviewed, no changes. Last detailed document date: 01/11/2014.      MEDICATIONS(added, continued or  stopped this visit): Started Medication Directions Instruction Stopped   Align 4 mg capsule take 1 tablet by mouth daily     Ambien 10 mg tablet take 1 tablet by oral route  every day at bedtime as needed for sleep    11/17/2012 Celebrex 200 mg capsule take 1 capsule by oral route 2 times every day as needed     cyclobenzaprine 10 mg tablet take 1 tablet by oral route 3 times every day as needed     fexofenadine 180 mg tablet take 1 tablet by oral route  every day    11/17/2012 Neurontin 400 mg capsule take 2 capsule by oral route 2 times every day  and 1 capsule PO midday ( 5 per day)     Protonix 40 mg tablet,delayed release take 1 tablet by oral route  every day     trazodone 150 mg tablet take 1 tablet by oral route  every day at bedtime     Vicodin 5 mg-500 mg tablet take 1 tablet by oral route twice daily as needed for pain     Vitamin D3 1,000 unit tablet take 1 tablet by mouth daily       ALLERGIES: Ingredient Reaction Medication Name Comment  NO KNOWN ALLERGIES     No known allergies. Reviewed, no changes.    Vitals Date Temp F BP Pulse Ht In Wt Lb BMI BSA Pain Score  09/27/2014  130/77 92 71 234 32.64  5/10      IMPRESSION Patient has cervical stenosis and cervical  myelopathy.  I recommended that he consider anterior cervical decompression and fusion at the C4 C5, C5 C6, C6 C7 levels.  Risks and benefits were discussed in detail with the patient.  He wishes to consider his options and will discuss this with his wife.  Completed Orders (this encounter) Order Details Reason Side Interpretation Result Initial Treatment Date Region  Lifestyle education regarding diet Encouraged to eat a well balanced diet and follow up with primary care physician.         Assessment/Plan # Detail Type Description   1. Assessment Radiculopathy, cervical region (M54.12).       2. 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       3. Assessment Cervical  myelopathy (G95.9).       4. Assessment Spinal stenosis, cervical region (M48.02).       5. Assessment Body mass index (BMI) 32.0-32.9, adult (B33.83).   Plan Orders Today's instructions / counseling include(s) Lifestyle education regarding diet.         Pain Assessment/Treatment Pain Scale: 5/10. Method: Numeric Pain Intensity Scale. Location: neck. Onset: 01/11/1985. Duration: varies. Quality: discomforting. Pain Assessment/Treatment follow-up plan of care: Patient is taking medications as prescribed..  Anterior cervical decompression and fusion C4 C5, C5 C6, C6 C7 levels.  Patient will call if he wishes to schedule this.  Orders: Instruction(s)/Education: Assessment Instruction  [phone] Lifestyle education regarding diet             Provider:  Marchia Meiers. Vertell Limber MD  09/28/2014 06:54 PM Dictation edited by: Marchia Meiers. Vertell Limber    CC Providers: Robert  Fried Virgie [address],  [place]  [id number]-   Robert Fried [facility] [address] [place] [address]-              Electronically signed by Marchia Meiers. Vertell Limber MD on 09/28/2014 06:54 PM

## 2014-11-24 NOTE — Evaluation (Signed)
Physical Therapy Evaluation Patient Details Name: [name] MRN: [id number] DOB: [birth year] Today's Date: 11/24/2014   History of Present Illness  Patient is a 64 yo male admitted 11/24/14 now s/p C4-7 ACDF.  Clinical Impression  Patient functioning at min guard to supervision level for all mobility and gait.  Able to negotiate stairs with min guard assist.  Provided education on cervical precautions.  Answered questions for patient and wife related to mobility.  No further acute PT needs identified.  PT will sign off.  Recommend 3-in-1 BSC for home use.  They may have one at home.  Wife to look tonight and let us know tomorrow whether they need one or not.    Follow Up Recommendations No PT follow up;Supervision for mobility/OOB    Equipment Recommendations  3in1 (PT) (Wife to look for [facility] at home this evening.)    Recommendations for Other Services       Precautions / Restrictions Precautions Precautions: Cervical Precaution Comments: Provided handout and reviewed with patient and wife. Required Braces or Orthoses: Cervical Brace Cervical Brace: Soft collar Restrictions Weight Bearing Restrictions: No      Mobility  Bed Mobility Overal bed mobility: Needs Assistance Bed Mobility: Rolling;Sidelying to Sit;Sit to Sidelying Rolling: Modified independent (Device/Increase time) Sidelying to sit: Min assist     Sit to sidelying: Min assist General bed mobility comments: Verbal cues for technique.  Assist to support trunk as patient moved to sitting.  Good balance once upright.  Assist to bring LE's onto bed to return to sidelying.  Transfers Overall transfer level: Needs assistance Equipment used: None Transfers: Sit to/from Stand Sit to Stand: Min guard         General transfer comment: Verbal cues for technique.  Assist for balance/safety.  Difficulty from lower surfaces.  Ambulation/Gait Ambulation/Gait assistance: Supervision Ambulation Distance (Feet): 300  Feet Assistive device: None Gait Pattern/deviations: Step-through pattern;Decreased stride length Gait velocity: decreased Gait velocity interpretation: Below normal speed for age/gender General Gait Details: Patient with slow, guarded gait to maintain cervical precautions.  Good balance during gait.  Stairs Stairs: Yes Stairs assistance: Min guard Stair Management: One rail Right;Step to pattern;Forwards Number of Stairs: 3 General stair comments: Verbal and visual cues to negotiate stairs using step-to technique with 1 rail.  Instructed wife in proper way to assist patient on stairs.  Wheelchair Mobility    Modified Rankin (Stroke Patients Only)       Balance                                             Pertinent Vitals/Pain Pain Assessment: 0-10 Pain Score: 4  Pain Location: neck Pain Descriptors / Indicators: Sore Pain Intervention(s): Monitored during session    Home Living Family/patient expects to be discharged to:: Private residence Living Arrangements: Spouse/significant other Available Help at Discharge: Family;Available 24 hours/day (Initially - then wife will return to work) Type of Home: House Home Access: Stairs to enter Entrance Stairs-Rails: None Entrance Stairs-Number of Steps: 3 Home Layout: Two level;Able to live on main level with bedroom/bathroom Home Equipment: Kasandra Knudsen - single point;Bedside commode (Wife to ensure [facility] still available)      Prior Function Level of Independence: Independent               Hand Dominance        Extremity/Trunk Assessment   Upper Extremity  Assessment: Overall WFL for tasks assessed           Lower Extremity Assessment: Overall WFL for tasks assessed         Communication   Communication: No difficulties  Cognition Arousal/Alertness: Awake/alert Behavior During Therapy: WFL for tasks assessed/performed Overall Cognitive Status: Within Functional Limits for tasks assessed                       General Comments      Exercises        Assessment/Plan    PT Assessment Patent does not need any further PT services  PT Diagnosis Abnormality of gait;Acute pain   PT Problem List    PT Treatment Interventions     PT Goals (Current goals can be found in the Care Plan section) Acute Rehab PT Goals Patient Stated Goal: To go home tomorrow. PT Goal Formulation: All assessment and education complete, DC therapy    Frequency     Barriers to discharge        Co-evaluation               End of Session   Activity Tolerance: Patient tolerated treatment well Patient left: in bed;with call bell/phone within reach;with family/visitor present (sitting EOB) Nurse Communication: Mobility status         Time: 6712-4580 PT Time Calculation (min) (ACUTE ONLY): 32 min   Charges:   PT Evaluation $Initial PT Evaluation Tier I: 1 Procedure PT Treatments $Gait Training: 8-22 mins   PT G CodesDespina Pole December 08, 2014, 7:25 PM Carita Pian. Sanjuana Kava, [place] Pager [phone]

## 2014-11-24 NOTE — Anesthesia Postprocedure Evaluation (Signed)
  Anesthesia Post-op Note  Patient: [name]  Procedure(s) Performed: Procedure(s) with comments: Cervical 4-5 Cervical 5-6 Cervical 6-7 Anterior cervical decompression/diskectomy/fusion (N/A) - C4-5 C5-6 C6-7 Anterior cervical decompression/diskectomy/fusion  Patient Location: PACU  Anesthesia Type:General  Level of Consciousness: awake  Airway and Oxygen Therapy: Patient Spontanous Breathing  Post-op Pain: mild  Post-op Assessment: Post-op Vital signs reviewed, Patient's Cardiovascular Status Stable, Respiratory Function Stable, Patent Airway and No signs of Nausea or vomiting              Post-op Vital Signs: Reviewed and stable  Last Vitals:  Filed Vitals:   11/24/14 1400  BP: 125/72  Pulse: 94  Temp: 36.5 C  Resp: 18    Complications: No apparent anesthesia complications

## 2014-11-24 NOTE — Brief Op Note (Signed)
11/24/2014  1:56 PM  PATIENT:  [name]  64 y.o. male  PRE-OPERATIVE DIAGNOSIS:  Cervical myelopathy with herniated cervical disc and cervical stenosis with cervical radiculopathy C 45, C 56, C 67 levels  POST-OPERATIVE DIAGNOSIS:  Cervical myelopathy with herniated cervical disc and cervical stenosis with cervical radiculopathy C 45, C 56, C 67 levels   PROCEDURE:  Procedure(s) with comments: Cervical 4-5 Cervical 5-6 Cervical 6-7 Anterior cervical decompression/diskectomy/fusion (N/A) - C4-5 C5-6 C6-7 Anterior cervical decompression/diskectomy/fusion with PEEK cages, autograft, allograft, plate  SURGEON:  Surgeon(s) and Role:    * Erline Levine, MD - Primary    * Karie Chimera, MD - Assisting  PHYSICIAN ASSISTANT:   ASSISTANTS: [place], RN   ANESTHESIA:   general  EBL:  Total I/O In: 1000 [I.V.:1000] Out: -   BLOOD ADMINISTERED:none  DRAINS: (10) Jackson-Pratt drain(s) with closed bulb suction in the prevertebral space   LOCAL MEDICATIONS USED:  LIDOCAINE   SPECIMEN:  No Specimen  DISPOSITION OF SPECIMEN:  N/A  COUNTS:  YES  TOURNIQUET:  * No tourniquets in log *  DICTATION: Patient was brought to operating room and following the smooth and uncomplicated induction of general endotracheal anesthesia his head was placed on a horseshoe head holder he was placed in 5 pounds of Holter traction and his anterior neck was prepped and draped in usual sterile fashion. An incision was made on the left side of midline after infiltrating the skin and subcutaneous tissues with local lidocaine. The platysmal layer was incised and subplatysmal dissection was performed exposing the anterior border sternocleidomastoid muscle. Using blunt dissection the carotid sheath was kept lateral and trachea and esophagus kept medial exposing the anterior cervical spine. A bent spinal needle was placed it was felt to be the C 34  level and this was confirmed on intraoperative x-ray. Longus coli  muscles were taken down from the anterior cervical spine using electrocautery and key elevator and self-retaining retractor was placed. The interspaces at C 45 and C 56 were incised and a thorough discectomy was performed. Distraction pins were placed. Uncinate spurs and central spondylitic ridges were drilled down with a high-speed drill. The spinal cord dura and both C6 nerve roots were widely decompressed. Hemostasis was assured. After trial sizing an 8 mm peek interbody cage was selected and packed with DBM and local autograft. The graft was tamped into position and countersunk appropriately. The retractor was moved and the interspace at C 45 was incised and a thorough discectomy was performed. Distraction pins were placed. Uncinate spurs and central spondylitic ridges were drilled down with a high-speed drill. The spinal cord dura and both C5 nerve roots were widely decompressed. Hemostasis was assured. After trial sizing a 7 mm peek interbody cage was selected and packed with profuse block and autograft. The graft was tamped into position and countersunk appropriately.The interspace at C 67 was incised and a thorough discectomy was performed. Distraction pins were placed. Uncinate spurs and central spondylitic ridges were drilled down with a high-speed drill. The spinal cord dura and both C 7 nerve roots were widely decompressed. A large fragment of herniated disc was removed on the right with decompression of the right C 7 nerve root. Hemostasis was assured. After trial sizing an 8 mm peek interbody cage was selected and packed in a similar fashion. The graft was tamped into position and countersunk appropriately.  Distraction weight was removed. A 58 mm Archon Nuvasive anterior cervical plate was affixed to the cervical spine with  13 mm variable-angle screws 2 at C4, 2 at C5, 2 at C6,  and 2 at C7. All screws were well-positioned and locking mechanisms were engaged. Soft tissues were inspected and found to  be in good repair. The wound was irrigated. A final x-ray was obtained with good visualization at C4 with the interbody graft well visualized. A #10 JP drain was inserted through a separate incision.  The platysma layer was closed with 3-0 Vicryl stitches and the skin was reapproximated with 3-0 Vicryl subcuticular stitches. The wound was dressed with Dermabond and an occlusive dressing. Counts were correct at the end of the case. Patient was extubated and taken to recovery in stable and satisfactory condition.    PLAN OF CARE: Admit to inpatient   PATIENT DISPOSITION:  PACU - hemodynamically stable.   Delay start of Pharmacological VTE agent (>24hrs) due to surgical blood loss or risk of bleeding: yes

## 2014-11-24 NOTE — Progress Notes (Signed)
Awake, alert, conversant.  MAEW.  Doing well.  

## 2014-11-24 NOTE — Anesthesia Procedure Notes (Signed)
Procedure Name: Intubation Date/Time: 11/24/2014 11:25 AM Performed by: Rogers Blocker Pre-anesthesia Checklist: Patient identified, Timeout performed, Emergency Drugs available, Suction available and Patient being monitored Patient Re-evaluated:Patient Re-evaluated prior to inductionOxygen Delivery Method: Circle system utilized Preoxygenation: Pre-oxygenation with 100% oxygen Intubation Type: IV induction Ventilation: Mask ventilation without difficulty Laryngoscope Size: Mac and 4 Grade View: Grade I Tube type: Oral Tube size: 7.5 mm Number of attempts: 1 Airway Equipment and Method: Stylet Placement Confirmation: ETT inserted through vocal cords under direct vision,  positive ETCO2 and CO2 detector Secured at: 21 cm Tube secured with: Tape Dental Injury: Teeth and Oropharynx as per pre-operative assessment

## 2014-11-24 NOTE — Progress Notes (Signed)
Patient ID: [name], male   DOB: 1950-10-26, 64 y.o.   MRN: [id number] Alert and conversant. Sitting at EOB, eating meal. No pain at present. Good strength BUE. Soft Collar in use. Incision without erythema, swelling, or drainage beneath Dermabond and honeycomb drsg. JP drain patent.  Plan for d/c to home in am. Pt verbalizes understanding of d/c instructions & agrees to call office on Tues to schedule office f/u visit.   Verdis Prime RN BSN

## 2014-11-24 NOTE — Progress Notes (Signed)
Awake, alert, conversant.  Full bilateral D/B/T/HI/PF/DF strength.  Doing well.

## 2014-11-24 NOTE — Transfer of Care (Signed)
Immediate Anesthesia Transfer of Care Note  Patient: [name]  Procedure(s) Performed: Procedure(s) with comments: Cervical 4-5 Cervical 5-6 Cervical 6-7 Anterior cervical decompression/diskectomy/fusion (N/A) - C4-5 C5-6 C6-7 Anterior cervical decompression/diskectomy/fusion  Patient Location: PACU  Anesthesia Type:General  Level of Consciousness: awake, patient cooperative and responds to stimulation  Airway & Oxygen Therapy: Patient Spontanous Breathing and Patient connected to face mask oxygen  Post-op Assessment: Report given to RN and Post -op Vital signs reviewed and stable  Post vital signs: Reviewed and stable  Last Vitals:  Filed Vitals:   11/24/14 0815  BP: 131/73  Pulse: 66  Temp: 36.4 C  Resp: 20    Complications: No apparent anesthesia complications

## 2014-11-25 NOTE — Discharge Instructions (Signed)

## 2014-11-25 NOTE — Discharge Summary (Signed)
Physician Discharge Summary  Patient ID: [name] MRN: [id number] DOB/AGE: 1950-08-03 64 y.o.  Admit date: 11/24/2014 Discharge date: 11/25/2014  Admission Diagnoses:  Discharge Diagnoses:  Active Problems:   Cervical stenosis of spinal canal   Discharged Condition: good  Hospital Course: Surgery yesterday for spinal cord compression with cervical corpectomy. Did well with good relief of symptoms. Ambuklated well. Wound healing well. Home pod 1 with specific instructions given.  Consults: None  Significant Diagnostic Studies: none   Treatments: surgery: C 6 corpectomy with removal of C 56 and C 67 disc  Discharge Exam: Blood pressure 123/75, pulse 90, temperature 97.4 F (36.3 C), temperature source Oral, resp. rate 18, height 5\' 11"  (1.803 m), weight 107.956 kg (238 lb), SpO2 97 %. Incision/Wound:clean and dry wound; neuro status improved  Disposition: 01-Home or Self Care     Medication List    ASK your doctor about these medications        albuterol 108 (90 BASE) MCG/ACT inhaler  Commonly known as:  PROVENTIL HFA;VENTOLIN HFA  Inhale 2 puffs into the lungs at bedtime as needed for wheezing or shortness of breath (cough).     ALIGN 4 MG Caps  Take 4 mg by mouth daily.     aspirin EC 81 MG tablet  Take 81 mg by mouth daily.     atorvastatin 20 MG tablet  Commonly known as:  LIPITOR  Take 20 mg by mouth at bedtime.     celecoxib 200 MG capsule  Commonly known as:  CELEBREX  Take 200 mg by mouth daily.     cholecalciferol 1000 UNITS tablet  Commonly known as:  VITAMIN D  Take 1,000 Units by mouth daily.     cimetidine 200 MG tablet  Commonly known as:  TAGAMET  Take 200 mg by mouth at bedtime.     cyclobenzaprine 10 MG tablet  Commonly known as:  FLEXERIL  Take 10 mg by mouth at bedtime. For back pain     esomeprazole 40 MG capsule  Commonly known as:  NEXIUM  Take 40 mg by mouth daily with breakfast.     fluticasone 50 MCG/ACT nasal spray   Commonly known as:  FLONASE  Place 1 spray into both nostrils daily as needed for allergies or rhinitis (congestion).     gabapentin 400 MG capsule  Commonly known as:  NEURONTIN  Take 800 mg by mouth 2 (two) times daily.     GLUCOS-CHONDROIT-HYALURON-MSM PO  Take 1 tablet by mouth 2 (two) times daily. Glucosamine 1500 with chondroiton, hyaluronic acid, msm     HYDROcodone-acetaminophen 5-325 MG per tablet  Commonly known as:  NORCO/VICODIN  Take 1 tablet by mouth See admin instructions. Take 1 tablet by mouth daily at bedtime and in the middle of the night, may take an additional tablet during the day as needed for pain     KRILL OIL PO  Take 350 mg by mouth daily.     lisinopril-hydrochlorothiazide 10-12.5 MG per tablet  Commonly known as:  PRINZIDE,ZESTORETIC  Take 1 tablet by mouth daily.     loratadine 10 MG tablet  Commonly known as:  CLARITIN  Take 10 mg by mouth daily.     Lutein 20 MG Caps  Take 20 mg by mouth daily.     tamsulosin 0.4 MG Caps capsule  Commonly known as:  FLOMAX  Take 0.4 mg by mouth daily with breakfast.     traZODone 150 MG tablet  Commonly known as:  DESYREL  Take 150 mg by mouth at bedtime. For sleep     zolpidem 10 MG tablet  Commonly known as:  AMBIEN  Take 10 mg by mouth at bedtime.         At home rest most of the time. Get up 9 or 10 times each day and take a 15 or 20 minute walk. No riding in the car and to your first postoperative appointment. If you have neck surgery you may shower from the chest down starting on the third postoperative day. If you had back surgery he may start showering on the third postoperative day with saran wrap wrapped around your incisional area 3 times. After the shower remove the saran wrap. Take pain medicine as needed and other medications as instructed. Call my office for an appointment.  SignedFaythe Ghee, MD 11/25/2014, 9:15 AM

## 2014-11-25 NOTE — Progress Notes (Signed)
Pt doing well. Pt and wife given D/C instructions with verbal understanding. Pt's incision is covered with gauze dressing and is clean and dry with no sign of infection. Pt's IV and JP drain were removed prior to D/C. Pt D/C'd home via walking @ 1030 per MD order. Pt is stable @ D/C and has no other needs at this time. Holli Humbles, RN

## 2014-11-25 NOTE — Evaluation (Signed)
Occupational Therapy Evaluation and Discharge Patient Details Name: [name] MRN: [id number] DOB: 16-Jun-1950 Today's Date: 11/25/2014    History of Present Illness Patient is a 64 yo male admitted 11/24/14 now s/p C4-7 ACDF.   Clinical Impression   This 64 yo male admitted with above presents to acute OT with all education completed, we will D/C from acute OT.    Follow Up Recommendations  No OT follow up    Equipment Recommendations  None recommended by OT       Precautions / Restrictions Precautions Precautions: Cervical Required Braces or Orthoses: Cervical Brace Cervical Brace: Soft collar Restrictions Weight Bearing Restrictions: No      Mobility Bed Mobility Overal bed mobility: Modified Independent Bed Mobility: Rolling;Sidelying to Sit Rolling: Modified independent (Device/Increase time) Sidelying to sit: Modified independent (Device/Increase time)          Transfers Overall transfer level: Modified independent Equipment used: None Transfers: Sit to/from Stand Sit to Stand: Modified independent (Device/Increase time)                   ADL Overall ADL's : Modified independent                                       General ADL Comments: Educated pt on not bending over to get LBD but to rather bring his legs up. Also educated him on using 2 cups for brushing his teeth (one with straw for rinse and one without straw for spitting). Educated on Conservation officer, historic buildings and using his hand to feel where he needs to shave more as opposed to turning his head to look.. Pt able to get up and down from comfort height toilet as instructed him for technique without issues     Vision Additional Comments: No change from baseline          Pertinent Vitals/Pain Pain Assessment: 0-10 Pain Score: 4  Pain Location: neck Pain Descriptors / Indicators: Aching Pain Intervention(s): Premedicated before session     Hand Dominance Right    Extremity/Trunk Assessment Upper Extremity Assessment Upper Extremity Assessment: Overall WFL for tasks assessed           Communication Communication Communication: No difficulties   Cognition Arousal/Alertness: Awake/alert Behavior During Therapy: WFL for tasks assessed/performed Overall Cognitive Status: Within Functional Limits for tasks assessed                                Home Living Family/patient expects to be discharged to:: Private residence Living Arrangements: Spouse/significant other Available Help at Discharge: Family;Available 24 hours/day (intially--then wife will return to work) Type of Home: House Home Access: Stairs to enter [employer] of Steps: 3 Entrance Stairs-Rails: None Home Layout: Two level;Able to live on main level with bedroom/bathroom     Bathroom Shower/Tub: Walk-in shower;Curtain   Bathroom Toilet: Standard     Home Equipment: Cane - single point;Bedside commode;Hand held shower head          Prior Functioning/Environment Level of Independence: Independent             OT Diagnosis: Generalized weakness;Acute pain         OT Goals(Current goals can be found in the care plan section) Acute Rehab OT Goals Patient Stated Goal: home today  OT Frequency:  End of Session Equipment Utilized During Treatment: Cervical collar Nurse Communication:  (need to ask wife when she gets her about the need/want of a 3n1)  Activity Tolerance: Patient tolerated treatment well Patient left: in chair;with call bell/phone within reach   Time: 0759-0826 OT Time Calculation (min): 27 min Charges:  OT General Charges $OT Visit: 1 Procedure OT Evaluation $Initial OT Evaluation Tier I: 1 Procedure OT Treatments $Self Care/Home Management : 8-22 mins  Almon Register [phone] 11/25/2014, 11:16 AM

## 2014-11-25 NOTE — Care Management (Signed)
Utilization review completed. Yuka Lallier, RN Case Manager [phone]. 

## 2014-11-28 ENCOUNTER — Encounter (HOSPITAL_COMMUNITY): Payer: Self-pay | Admitting: Neurosurgery

## 2015-01-10 IMAGING — CR DG ORBITS FOR FOREIGN BODY
2 series · 2 of 2 positions shown · non-contrast
Comparison: None.

CLINICAL DATA: Rule out orbital foreign body prior to MRI

ORBITS FOR FOREIGN BODY - 2 VIEW

[w waters * (1 of 2)]
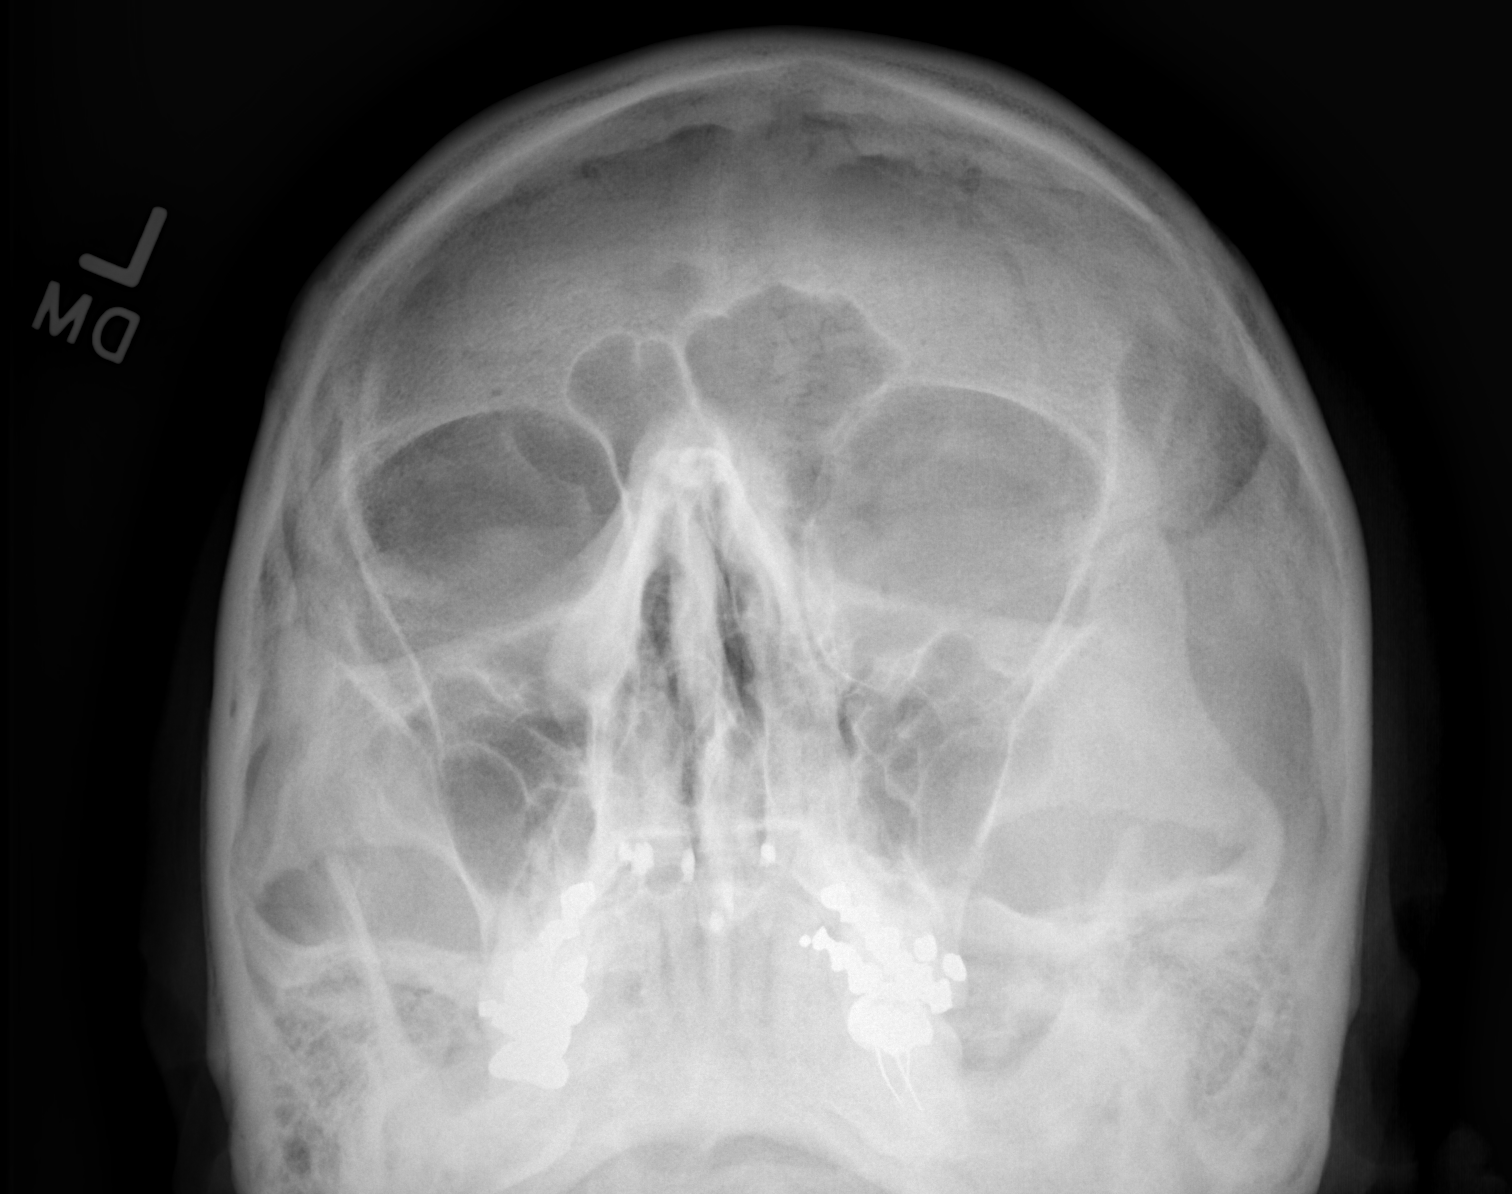

[w waters * (2 of 2)]
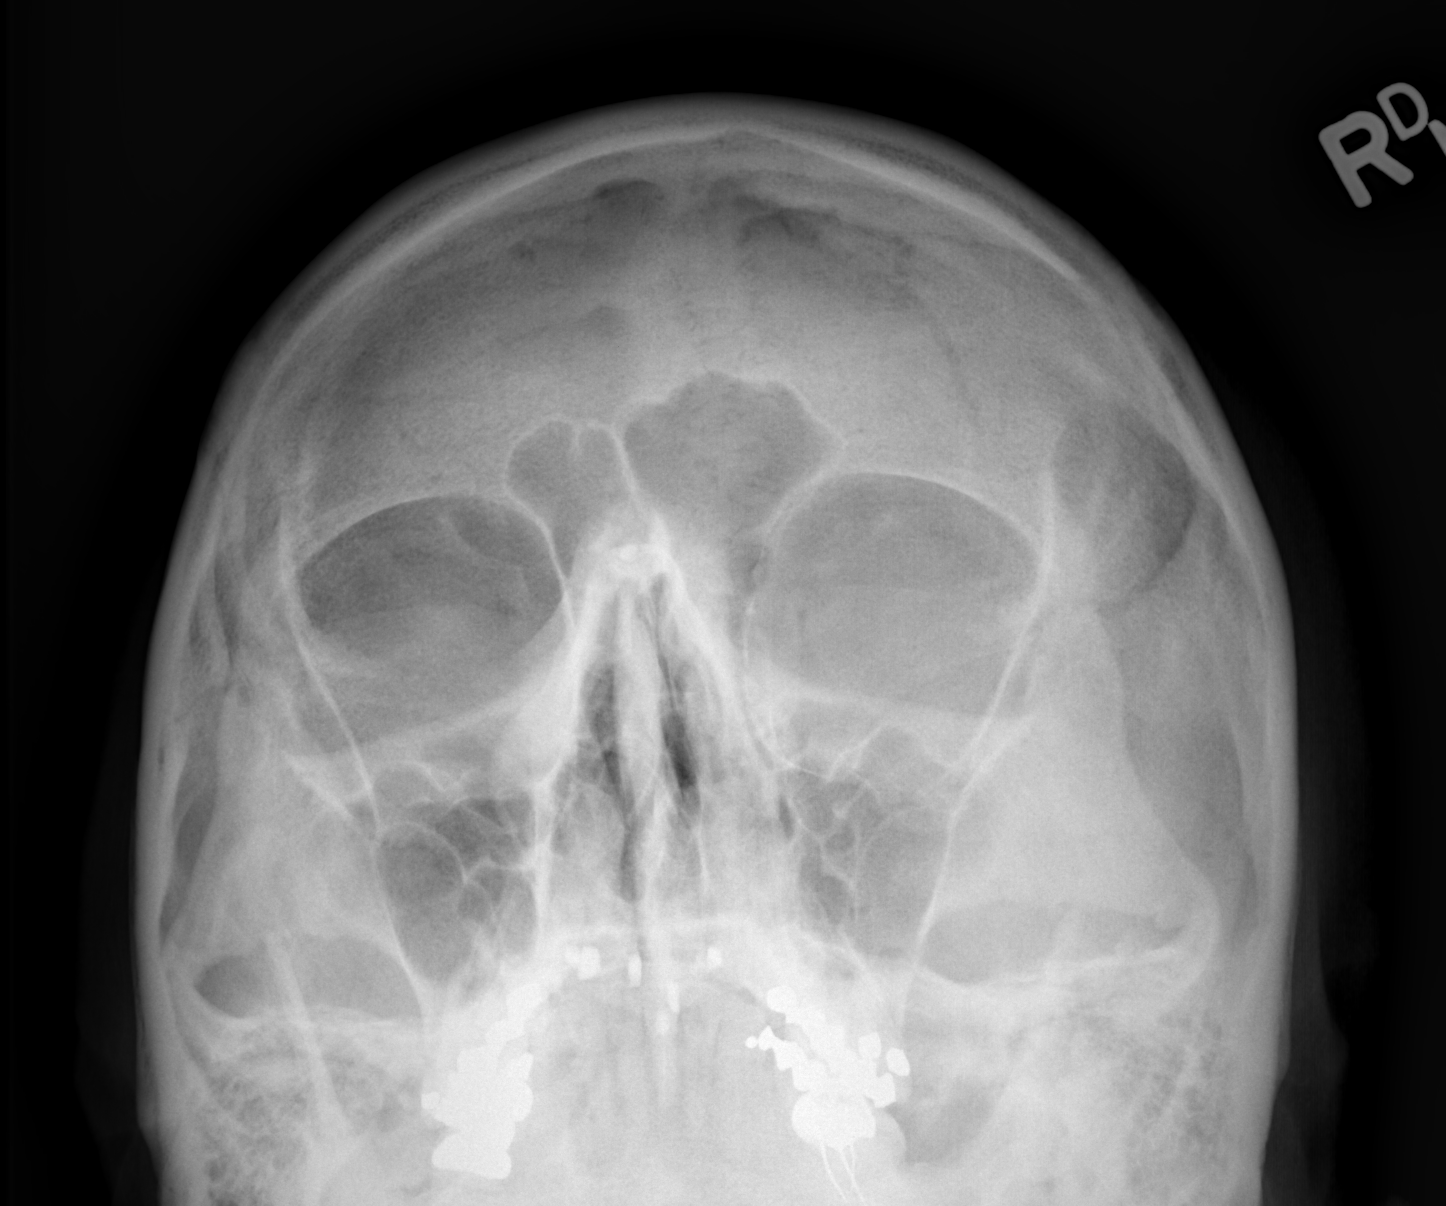

[2 of 2 positions shown; findings below may reference images not displayed]

FINDINGS: Two views bilateral orbits submitted.  No orbital
metallic foreign body is identified.  Metallic dental artifact are
noted.
IMPRESSION: No orbital metallic foreign body is identified.

## 2015-02-04 ENCOUNTER — Emergency Department (HOSPITAL_COMMUNITY): Payer: 59

## 2015-02-04 ENCOUNTER — Emergency Department (HOSPITAL_COMMUNITY)
Admission: EM | Admit: 2015-02-04 | Discharge: 2015-02-04 | Disposition: A | Discharge status: Home / Self Care | Payer: 59 | Attending: Emergency Medicine | Admitting: Emergency Medicine

## 2015-02-04 ENCOUNTER — Encounter (HOSPITAL_COMMUNITY): Payer: Self-pay | Admitting: *Deleted

## 2015-02-04 DIAGNOSIS — G4700 Insomnia, unspecified: Secondary | ICD-10-CM | POA: Insufficient documentation

## 2015-02-04 DIAGNOSIS — Z85828 Personal history of other malignant neoplasm of skin: Secondary | ICD-10-CM | POA: Diagnosis not present

## 2015-02-04 DIAGNOSIS — G8929 Other chronic pain: Secondary | ICD-10-CM | POA: Insufficient documentation

## 2015-02-04 DIAGNOSIS — Z87442 Personal history of urinary calculi: Secondary | ICD-10-CM | POA: Diagnosis not present

## 2015-02-04 DIAGNOSIS — J45909 Unspecified asthma, uncomplicated: Secondary | ICD-10-CM | POA: Insufficient documentation

## 2015-02-04 DIAGNOSIS — E785 Hyperlipidemia, unspecified: Secondary | ICD-10-CM | POA: Insufficient documentation

## 2015-02-04 DIAGNOSIS — Z87891 Personal history of nicotine dependence: Secondary | ICD-10-CM | POA: Diagnosis not present

## 2015-02-04 DIAGNOSIS — I10 Essential (primary) hypertension: Secondary | ICD-10-CM | POA: Insufficient documentation

## 2015-02-04 DIAGNOSIS — Z7982 Long term (current) use of aspirin: Secondary | ICD-10-CM | POA: Insufficient documentation

## 2015-02-04 DIAGNOSIS — R109 Unspecified abdominal pain: Secondary | ICD-10-CM | POA: Diagnosis not present

## 2015-02-04 DIAGNOSIS — G4730 Sleep apnea, unspecified: Secondary | ICD-10-CM | POA: Insufficient documentation

## 2015-02-04 DIAGNOSIS — M5442 Lumbago with sciatica, left side: Secondary | ICD-10-CM | POA: Diagnosis not present

## 2015-02-04 DIAGNOSIS — H9190 Unspecified hearing loss, unspecified ear: Secondary | ICD-10-CM | POA: Insufficient documentation

## 2015-02-04 DIAGNOSIS — K219 Gastro-esophageal reflux disease without esophagitis: Secondary | ICD-10-CM | POA: Insufficient documentation

## 2015-02-04 DIAGNOSIS — Z79899 Other long term (current) drug therapy: Secondary | ICD-10-CM | POA: Diagnosis not present

## 2015-02-04 DIAGNOSIS — Z7951 Long term (current) use of inhaled steroids: Secondary | ICD-10-CM | POA: Insufficient documentation

## 2015-02-04 LAB — CBC WITH DIFFERENTIAL/PLATELET
BASOS PCT: 0 %
Basophils Absolute: 0 10*3/uL (ref 0.0–0.1)
EOS ABS: 0.1 10*3/uL (ref 0.0–0.7)
Eosinophils Relative: 1 %
HCT: 40.7 % (ref 39.0–52.0)
HEMOGLOBIN: 13.4 g/dL (ref 13.0–17.0)
LYMPHS ABS: 2.7 10*3/uL (ref 0.7–4.0)
Lymphocytes Relative: 27 %
MCH: 29 pg (ref 26.0–34.0)
MCHC: 32.9 g/dL (ref 30.0–36.0)
MCV: 88.1 fL (ref 78.0–100.0)
Monocytes Absolute: 0.6 10*3/uL (ref 0.1–1.0)
Monocytes Relative: 6 %
NEUTROS ABS: 6.7 10*3/uL (ref 1.7–7.7)
NEUTROS PCT: 66 %
Platelets: 238 10*3/uL (ref 150–400)
RBC: 4.62 MIL/uL (ref 4.22–5.81)
RDW: 13.7 % (ref 11.5–15.5)
WBC: 10.2 10*3/uL (ref 4.0–10.5)

## 2015-02-04 LAB — COMPREHENSIVE METABOLIC PANEL
ALBUMIN: 4.1 g/dL (ref 3.5–5.0)
ALK PHOS: 76 U/L (ref 38–126)
ALT: 30 U/L (ref 17–63)
AST: 31 U/L (ref 15–41)
Anion gap: 8 (ref 5–15)
BUN: 15 mg/dL (ref 6–20)
CALCIUM: 8.9 mg/dL (ref 8.9–10.3)
CO2: 27 mmol/L (ref 22–32)
CREATININE: 0.81 mg/dL (ref 0.61–1.24)
Chloride: 103 mmol/L (ref 101–111)
GFR calc Af Amer: >60 mL/min (ref >60)
GFR calc non Af Amer: >60 mL/min (ref >60)
GLUCOSE: 106 mg/dL — AB (ref 65–99)
Potassium: 3.9 mmol/L (ref 3.5–5.1)
SODIUM: 138 mmol/L (ref 135–145)
Total Bilirubin: 0.6 mg/dL (ref 0.3–1.2)
Total Protein: 7.2 g/dL (ref 6.5–8.1)

## 2015-02-04 LAB — URINALYSIS, ROUTINE W REFLEX MICROSCOPIC
Glucose, UA: NEGATIVE mg/dL
HGB URINE DIPSTICK: NEGATIVE
Ketones, ur: NEGATIVE mg/dL
Leukocytes, UA: NEGATIVE
Nitrite: NEGATIVE
PH: 5 (ref 5.0–8.0)
Protein, ur: NEGATIVE mg/dL
SPECIFIC GRAVITY, URINE: 1.033 — AB (ref 1.005–1.030)
Urobilinogen, UA: 1 mg/dL (ref 0.0–1.0)

## 2015-02-04 MED ORDER — HYDROMORPHONE HCL 1 MG/ML IJ SOLN
1.0000 mg | Freq: Once | INTRAMUSCULAR | Status: AC
  Administered 2015-02-04: 1 mg via INTRAVENOUS
  Filled 2015-02-04: qty 1

## 2015-02-04 MED ORDER — KETOROLAC TROMETHAMINE 30 MG/ML IJ SOLN
30.0000 mg | Freq: Once | INTRAMUSCULAR | Status: AC
  Administered 2015-02-04: 30 mg via INTRAVENOUS
  Filled 2015-02-04: qty 1

## 2015-02-04 MED ORDER — ONDANSETRON HCL 4 MG/2ML IJ SOLN
4.0000 mg | Freq: Once | INTRAMUSCULAR | Status: AC
  Administered 2015-02-04: 4 mg via INTRAVENOUS
  Filled 2015-02-04: qty 2

## 2015-02-04 MED ORDER — NAPROXEN 500 MG PO TABS: 500.0000 mg | ORAL_TABLET | Freq: Two times a day (BID) | ORAL | Status: DC

## 2015-02-04 MED ORDER — SODIUM CHLORIDE 0.9 % IV BOLUS (SEPSIS)
1000.0000 mL | Freq: Once | INTRAVENOUS | Status: AC
  Administered 2015-02-04: 1000 mL via INTRAVENOUS

## 2015-02-04 NOTE — Discharge Instructions (Signed)

## 2015-02-04 NOTE — ED Notes (Signed)
EKG preformed to due pt appearing to be in A-Fib on monitor

## 2015-02-04 NOTE — ED Provider Notes (Signed)
CSN: [id number]     Arrival date & time 02/04/15  [id number] History   First MD Initiated Contact with Patient 02/04/15 [phone]     Chief Complaint  Patient presents with  . Flank Pain     (Consider location/radiation/quality/duration/timing/severity/associated sxs/prior Treatment) HPI Comments: Sharp pain, left lower back, left flank, radiating to LLQ and down L leg Comes and goes 2 weeks duration Nothing makes it better or worse Home pain medications not helping Hx of diverticula and nephrolithiasis Feels like prior kidney stones No diarrhea/fevers No loss control bowel/bladder/numnbess/weakness   Past Medical History  Diagnosis Date  . Asthma   . Diverticulitis   . Gastric ulcer   . Elevated blood pressure 03/02/13    (x) 2 years. Referral to cardiology  . SOB (shortness of breath)   . Chronic back pain     On celebrex  . Chronic neck pain     On celebrex  . Midsternal chest pain     Has had for a while, which he contributed to hiatal hernia and peptic disease. No radiation, not sure about duration, not associated with diaphoresis.  . Dizziness     occational  . DOE (dyspnea on exertion)     occasional. Now can also feel when he is at rest. Former smoker.  . Peptic disease   . Hiatal hernia   . Chest discomfort     Could be related to hiatal hernia but because of Hx of HTN, HLD and + FHX, an exercise stress test & referral to cardiology will be arranged.  . Esophageal reflux   . Benign essential hypertension     Low salt diet. Systolic goal [id number], diastolic goal 0000000.  Marland Kitchen Hyperlipidemia     Last FLP in 2012 slightly elevated. LDL goal <100.  Marland Kitchen Allergic rhinitis, cause unspecified   . Cervicalgia   . Pain in joint, site unspecified   . Sleep disturbance, unspecified   . Chronic insomnia   . Abdominal pain   . Dyspepsia   . Diarrhea   . Blood in stool   . Tremor     Per Dr. [name]  . 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 [name]   . Diverticulosis   . Carpal tunnel syndrome, bilateral   . HOH (hard of hearing)   . Sleep apnea syndrome     was to try cpap-did not get one-repeat studies could not sleep  . Complication of anesthesia     woke up slowly after deviated septum surg. Frances Maywood.   . Cancer ([place])     basal cell removed several places   . Degenerative disk disease     degenerative in cervical area & lumbar area  . Kidney stones     passed spontaneously  . Ventral hernia     never corrected   . 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 Appendectomy    . Nasal septum surgery    . Colonoscopy    . Upper gi endoscopy    . Carpal tunnel release  4/14    rt  . Carpal tunnel release Left 05/17/2013    Procedure: LEFT CARPAL TUNNEL RELEASE;  Surgeon: Cammie Sickle., MD;  Location: [facility];  Service: Orthopedics;  Laterality: Left;  Marland Kitchen Eye surgery    . Retinal  detachment surgery  06/2014    /w Dr. Baird Cancer  . Anterior cervical decomp/discectomy fusion N/A 11/24/2014    Procedure: Cervical 4-5 Cervical 5-6 Cervical 6-7 Anterior cervical decompression/diskectomy/fusion;  Surgeon: Erline Levine, MD;  Location: [facility];  Service: Neurosurgery;  Laterality: N/A;  C4-5 C5-6 C6-7 Anterior cervical decompression/diskectomy/fusion   Family History  Problem Relation Age of Onset  . CAD    . Heart attack Father     Deceased, 54  . CAD Brother   . Hypertension Brother   . Hyperlipidemia Brother   . Alzheimer's disease Mother     Deceased, 25  . Hyperlipidemia Sister   . Hypertension Sister    Social History  Substance Use Topics  . Smoking status: Former Smoker -- 0.75 packs/day for 15 years    Types: Cigarettes    Quit date: 03/25/1991  . Smokeless tobacco: Never Used  . Alcohol Use: 1.2 oz/week    1 Glasses of wine, 1 Cans of beer per week     Comment: 1-2 drinks per night     Review of Systems  Constitutional: Negative for fever.  HENT: Negative for sore throat.   Eyes: Negative for visual disturbance.  Respiratory: Negative for shortness of breath.   Cardiovascular: Negative for chest pain.  Gastrointestinal: Positive for nausea and abdominal pain. Negative for vomiting, diarrhea and constipation.  Genitourinary: Positive for flank pain and difficulty urinating (chronic difficulty, slightly worse). Negative for dysuria and hematuria.  Musculoskeletal: Positive for back pain. Negative for neck stiffness.  Skin: Negative for rash.  Neurological: Negative for syncope, weakness, numbness and headaches.      Allergies  Review of patient's allergies indicates no known allergies.  Home Medications   Prior to Admission medications   Medication Sig Start Date End Date Taking? Authorizing Provider  albuterol (PROVENTIL HFA;VENTOLIN HFA) 108 (90 BASE) MCG/ACT inhaler Inhale 2 puffs into the lungs at bedtime as needed for wheezing or shortness of breath (cough).   Yes Historical Provider, MD  aspirin 81 MG tablet Take 81 mg by mouth daily.   Yes Historical Provider, MD  atorvastatin (LIPITOR) 20 MG tablet Take 20 mg by mouth at bedtime.    Yes Historical Provider, MD  celecoxib (CELEBREX) 200 MG capsule Take 200 mg by mouth 2 (two) times daily.   Yes Historical Provider, MD  cholecalciferol (VITAMIN D) 1000 UNITS tablet Take 1,000 Units by mouth daily.   Yes Historical Provider, MD  cimetidine (TAGAMET) 200 MG tablet Take 200 mg by mouth at bedtime.   Yes Historical Provider, MD  cyclobenzaprine (FLEXERIL) 10 MG tablet Take 10 mg by mouth at bedtime. For back pain   Yes Historical Provider, MD  esomeprazole (NEXIUM) 40 MG capsule Take 40 mg by mouth daily with breakfast.    Yes Historical Provider, MD  fluticasone (FLONASE) 50 MCG/ACT nasal spray Place 1 spray into both nostrils daily as needed for allergies or rhinitis (congestion).   Yes Historical Provider, MD   gabapentin (NEURONTIN) 400 MG capsule Take 800 mg by mouth 2 (two) times daily.   Yes Historical Provider, MD  GLUCOS-CHONDROIT-HYALURON-MSM PO Take 1 tablet by mouth 2 (two) times daily. Glucosamine 1500 with chondroiton, hyaluronic acid, msm   Yes Historical Provider, MD  HYDROcodone-acetaminophen (NORCO/VICODIN) 5-325 MG per tablet Take 1 tablet by mouth See admin instructions. Take 1 tablet by mouth daily at bedtime and in the middle of the night, may take an additional tablet during the day as needed for pain  Yes Historical Provider, MD  KRILL OIL PO Take 350 mg by mouth daily.   Yes Historical Provider, MD  lisinopril-hydrochlorothiazide (PRINZIDE,ZESTORETIC) 10-12.5 MG per tablet Take 1 tablet by mouth daily.   Yes Historical Provider, MD  loratadine (CLARITIN) 10 MG tablet Take 10 mg by mouth daily.    Yes Historical Provider, MD  Probiotic Product (ALIGN) 4 MG CAPS Take 4 mg by mouth daily.    Yes Historical Provider, MD  tamsulosin (FLOMAX) 0.4 MG CAPS capsule Take 0.4 mg by mouth daily with breakfast. 10/31/14  Yes Historical Provider, MD  traZODone (DESYREL) 150 MG tablet Take 150 mg by mouth at bedtime. For sleep   Yes Historical Provider, MD  zolpidem (AMBIEN) 10 MG tablet Take 10 mg by mouth at bedtime.   Yes Historical Provider, MD  Lutein 20 MG CAPS Take 20 mg by mouth daily.    Historical Provider, MD  naproxen (NAPROSYN) 500 MG tablet Take 1 tablet (500 mg total) by mouth 2 (two) times daily. 02/04/15   Gareth Morgan, MD   BP 111/70 mmHg  Pulse 69  Temp(Src) 98.1 F (36.7 C) (Oral)  Resp 15  Wt 225 lb (102.059 kg)  SpO2 97% Physical Exam  Constitutional: He is oriented to person, place, and time. He appears well-developed and well-nourished. No distress.  HENT:  Head: Normocephalic and atraumatic.  Eyes: Conjunctivae and EOM are normal.  Neck: Normal range of motion.  Cardiovascular: Normal rate, regular rhythm, normal heart sounds and intact distal pulses.  Exam  reveals no gallop and no friction rub.   No murmur heard. Pulmonary/Chest: Effort normal and breath sounds normal. No respiratory distress. He has no wheezes. He has no rales.  Abdominal: Soft. He exhibits no distension. There is tenderness (mild llq). There is CVA tenderness (L). There is no guarding.  Musculoskeletal: He exhibits no edema.       Lumbar back: He exhibits tenderness. He exhibits no bony tenderness.  Neurological: He is alert and oriented to person, place, and time. He has normal strength. No sensory deficit. GCS eye subscore is 4. GCS verbal subscore is 5. GCS motor subscore is 6.  5/5 strength LE  Skin: Skin is warm and dry. He is not diaphoretic.  Nursing note and vitals reviewed.   ED Course  Procedures (including critical care time) Labs Review Labs Reviewed  COMPREHENSIVE METABOLIC PANEL - Abnormal; Notable for the following:    Glucose, Bld 106 (*)    All other components within normal limits  URINALYSIS, ROUTINE W REFLEX MICROSCOPIC (NOT AT [facility]) - Abnormal; Notable for the following:    Color, Urine AMBER (*)    Specific Gravity, Urine 1.033 (*)    Bilirubin Urine SMALL (*)    All other components within normal limits  URINE CULTURE  CBC WITH DIFFERENTIAL/PLATELET    Imaging Review Ct Renal Stone Study  02/04/2015  CLINICAL DATA:  Left flank pain for 2 weeks, worsening today. Pain extending to the left leg. EXAM: CT ABDOMEN AND PELVIS WITHOUT CONTRAST TECHNIQUE: Multidetector CT imaging of the abdomen and pelvis was performed following the standard protocol without IV contrast. COMPARISON:  01/16/2012 FINDINGS: Lung bases:  Clear.  Heart normal in size. Liver:  Fatty infiltration.  No mass or focal lesion. Spleen, gallbladder, pancreas, adrenal glands:  Normal. Kidneys, ureters, bladder: No renal masses. No renal or ureteral stones. No hydronephrosis. Ureters normal in course and caliber. Bladder is unremarkable. Lymph nodes:  No adenopathy. Ascites:  None.  Gastrointestinal: There  scattered colonic diverticula with no evidence of diverticulitis. Colon otherwise unremarkable. Normal stomach and small bowel. Musculoskeletal: There are degenerative changes of the visualized spine. No osteoblastic or osteolytic lesions. IMPRESSION: 1. No acute findings. No evidence of renal or ureteral stones or obstructive uropathy. 2. Hepatic steatosis. Degenerative changes noted throughout the visualized spine. Scattered colonic diverticula without diverticulitis. Electronically Signed   By: Lajean Manes M.D.   On: 02/04/2015 10:48   I have personally reviewed and evaluated these images and lab results as part of my medical decision-making.   EKG Interpretation   Date/Time:  Sunday February 04 2015 09:46:17 EST Ventricular Rate:  80 PR Interval:  171 QRS Duration: 86 QT Interval:  411 QTC Calculation: 474 R Axis:   31 Text Interpretation:  Sinus rhythm Low voltage, precordial leads  Borderline T abnormalities, inferior leads ED PHYSICIAN INTERPRETATION  AVAILABLE IN [facility]   Confirmed by TEST, Record ([id number]) on  02/04/2015 12:01:47 PM      MDM   Final diagnoses:  Left flank pain  Left-sided low back pain with left-sided sciatica   64yo male with history of GERD, degenerative disc disease, nephrolithiasis, CAD, cervical discectomy and fusion 11/2014, chronic pain, presents with concern for left flank pain.  Patient states pain is similar to prior nephrolithiasis. CT stone studey shows no sign of nephrolithiasis. No sign of UTI/pyelonephritis. Doubt diverticulitis given no diarrhea, no leukocytosis, no abd on CT.  With pain radiating down leg, likely represents exacerbation of lower back pain.  No sign of cord compression/cauda equina.  Pt has flexeril and norco at home. Wrote rx for naproxen for improved pain control and recommend follow up with PCP.    Gareth Morgan, MD 02/05/15 1244

## 2015-02-04 NOTE — ED Notes (Signed)
Pt states hx of kidney stones.  States intermittent LLQ pain that radiates to back and down L leg, some nausea with pain.

## 2015-02-05 LAB — URINE CULTURE

## 2015-02-10 ENCOUNTER — Encounter (HOSPITAL_COMMUNITY): Payer: Self-pay | Admitting: Emergency Medicine

## 2015-02-10 ENCOUNTER — Emergency Department (HOSPITAL_COMMUNITY)
Admission: EM | Admit: 2015-02-10 | Discharge: 2015-02-10 | Disposition: A | Discharge status: Home / Self Care | Payer: 59 | Attending: Emergency Medicine | Admitting: Emergency Medicine

## 2015-02-10 ENCOUNTER — Emergency Department (HOSPITAL_COMMUNITY): Payer: 59

## 2015-02-10 DIAGNOSIS — M549 Dorsalgia, unspecified: Secondary | ICD-10-CM

## 2015-02-10 DIAGNOSIS — K219 Gastro-esophageal reflux disease without esophagitis: Secondary | ICD-10-CM | POA: Diagnosis not present

## 2015-02-10 DIAGNOSIS — E785 Hyperlipidemia, unspecified: Secondary | ICD-10-CM | POA: Insufficient documentation

## 2015-02-10 DIAGNOSIS — J45909 Unspecified asthma, uncomplicated: Secondary | ICD-10-CM | POA: Diagnosis not present

## 2015-02-10 DIAGNOSIS — Z8589 Personal history of malignant neoplasm of other organs and systems: Secondary | ICD-10-CM | POA: Insufficient documentation

## 2015-02-10 DIAGNOSIS — G8929 Other chronic pain: Secondary | ICD-10-CM | POA: Diagnosis not present

## 2015-02-10 DIAGNOSIS — M5416 Radiculopathy, lumbar region: Secondary | ICD-10-CM | POA: Diagnosis not present

## 2015-02-10 DIAGNOSIS — Z8739 Personal history of other diseases of the musculoskeletal system and connective tissue: Secondary | ICD-10-CM | POA: Diagnosis not present

## 2015-02-10 DIAGNOSIS — I10 Essential (primary) hypertension: Secondary | ICD-10-CM | POA: Insufficient documentation

## 2015-02-10 DIAGNOSIS — G4700 Insomnia, unspecified: Secondary | ICD-10-CM | POA: Diagnosis not present

## 2015-02-10 DIAGNOSIS — Z791 Long term (current) use of non-steroidal anti-inflammatories (NSAID): Secondary | ICD-10-CM | POA: Insufficient documentation

## 2015-02-10 DIAGNOSIS — R1032 Left lower quadrant pain: Secondary | ICD-10-CM | POA: Diagnosis present

## 2015-02-10 DIAGNOSIS — Z79899 Other long term (current) drug therapy: Secondary | ICD-10-CM | POA: Diagnosis not present

## 2015-02-10 DIAGNOSIS — Z87891 Personal history of nicotine dependence: Secondary | ICD-10-CM | POA: Insufficient documentation

## 2015-02-10 DIAGNOSIS — Z7982 Long term (current) use of aspirin: Secondary | ICD-10-CM | POA: Diagnosis not present

## 2015-02-10 DIAGNOSIS — Z87442 Personal history of urinary calculi: Secondary | ICD-10-CM | POA: Diagnosis not present

## 2015-02-10 MED ORDER — HYDROMORPHONE HCL 1 MG/ML IJ SOLN
1.0000 mg | Freq: Once | INTRAMUSCULAR | Status: AC
  Administered 2015-02-10: 1 mg via INTRAVENOUS
  Filled 2015-02-10: qty 1

## 2015-02-10 MED ORDER — OXYCODONE-ACETAMINOPHEN 5-325 MG PO TABS
1.0000 | ORAL_TABLET | Freq: Once | ORAL | Status: AC
  Administered 2015-02-10: 1 via ORAL
  Filled 2015-02-10: qty 1

## 2015-02-10 NOTE — Progress Notes (Signed)
Patient ID: [name], male   DOB: 1950-11-21, 64 y.o.   MRN: [id number] Patient seen. R/x given. Large leff extraforaminal hnp. To see dr Encarnacion Chu for surgical advise

## 2015-02-10 NOTE — ED Notes (Signed)
Pt. Left with all belongings. Discharge instructions were reviewed and all questions were answered.  

## 2015-02-10 NOTE — ED Provider Notes (Signed)
Planes of low back pain radiating to left thigh onset 2 weeks ago worse with lying on his back or weightbearing on left leg. Improved with remaining still. No fever no loss of bladder bowel control no other associated symptoms. No treatment prior to coming here. Patient had MRI scan ordered by Dr. Vertell Limber however he could not tolerate the study  7:10 PM pain is severe when patient attempts to get up to walk. I spoke with Dr. Joya Salm who will come to evaluate patient. He is comfortable when lying still and declines further pain medication ` Results for orders placed or performed during the hospital encounter of 02/04/15  Urine culture  Result Value Ref Range   Specimen Description URINE, CLEAN CATCH    Special Requests NONE    Culture MULTIPLE SPECIES PRESENT, SUGGEST RECOLLECTION    Report Status 02/05/2015 FINAL   CBC with Differential  Result Value Ref Range   WBC 10.2 4.0 - 10.5 K/uL   RBC 4.62 4.22 - 5.81 MIL/uL   Hemoglobin 13.4 13.0 - 17.0 g/dL   HCT 40.7 39.0 - 52.0 %   MCV 88.1 78.0 - 100.0 fL   MCH 29.0 26.0 - 34.0 pg   MCHC 32.9 30.0 - 36.0 g/dL   RDW 13.7 11.5 - 15.5 %   Platelets 238 150 - 400 K/uL   Neutrophils Relative % 66 %   Neutro Abs 6.7 1.7 - 7.7 K/uL   Lymphocytes Relative 27 %   Lymphs Abs 2.7 0.7 - 4.0 K/uL   Monocytes Relative 6 %   Monocytes Absolute 0.6 0.1 - 1.0 K/uL   Eosinophils Relative 1 %   Eosinophils Absolute 0.1 0.0 - 0.7 K/uL   Basophils Relative 0 %   Basophils Absolute 0.0 0.0 - 0.1 K/uL  Comprehensive metabolic panel  Result Value Ref Range   Sodium 138 135 - 145 mmol/L   Potassium 3.9 3.5 - 5.1 mmol/L   Chloride 103 101 - 111 mmol/L   CO2 27 22 - 32 mmol/L   Glucose, Bld 106 (H) 65 - 99 mg/dL   BUN 15 6 - 20 mg/dL   Creatinine, Ser 0.81 0.61 - 1.24 mg/dL   Calcium 8.9 8.9 - 10.3 mg/dL   Total Protein 7.2 6.5 - 8.1 g/dL   Albumin 4.1 3.5 - 5.0 g/dL   AST 31 15 - 41 U/L   ALT 30 17 - 63 U/L   Alkaline Phosphatase 76 38 - 126 U/L   Total Bilirubin 0.6 0.3 - 1.2 mg/dL   GFR calc non Af Amer >60 >60 mL/min   GFR calc Af Amer >60 >60 mL/min   Anion gap 8 5 - 15  Urinalysis, Routine w reflex microscopic (not at [facility])  Result Value Ref Range   Color, Urine AMBER (A) YELLOW   APPearance CLEAR CLEAR   Specific Gravity, Urine 1.033 (H) 1.005 - 1.030   pH 5.0 5.0 - 8.0   Glucose, UA NEGATIVE NEGATIVE mg/dL   Hgb urine dipstick NEGATIVE NEGATIVE   Bilirubin Urine SMALL (A) NEGATIVE   Ketones, ur NEGATIVE NEGATIVE mg/dL   Protein, ur NEGATIVE NEGATIVE mg/dL   Urobilinogen, UA 1.0 0.0 - 1.0 mg/dL   Nitrite NEGATIVE NEGATIVE   Leukocytes, UA NEGATIVE NEGATIVE   Mr Lumbar Spine Wo Contrast  02/10/2015  CLINICAL DATA:  Low back pain radiating into the left flank, left lower quadrant and left leg for 2 weeks. Initial encounter. EXAM: MRI LUMBAR SPINE WITHOUT CONTRAST TECHNIQUE: Multiplanar, multisequence MR imaging  of the lumbar spine was performed. No intravenous contrast was administered. COMPARISON:  Abdominal CT 02/04/2015 and 01/16/2012. FINDINGS: Segmentation: Conventional anatomy assumed, with the last open disc space designated L5-S1. Alignment: Stable with mild straightening. No focal angulation or listhesis. Bones: No worrisome osseous lesion, acute fracture or pars defect. There are Schmorl's nodes throughout the lumbar spine with endplate degenerative changes at L4-5 and L5-S1. The lumbar pedicles are short on a congenital basis. Conus medullaris: Extends to the L1 level and appears normal. Paraspinal and other soft tissues: No significant paraspinal findings. Disc levels: L1-2: Loss of disc height with mild annular disc bulging, paraspinal osteophyte formation and facet hypertrophy. No significant spinal stenosis or nerve root encroachment. L2-3: Mild loss of disc height with mild annular bulging. No disc herniation, spinal stenosis or nerve root encroachment. L3-4: Loss of disc height with annular disc bulging, facet and  ligamentous hypertrophy, contributing to mild spinal stenosis. There is mild narrowing of the lateral recesses. The foramina appear sufficiently patent. L4-5: Loss of disc height with annular disc bulging, facet and ligamentous hypertrophy. There is a superimposed moderate-sized extraforaminal disc extrusion on the left, best seen on the sagittal images. This causes left L4 nerve root encroachment. There is underlying mild spinal stenosis with mild narrowing of the lateral recesses and foramina bilaterally. L5-S1: Chronic degenerative disc disease with loss of disc height, disc bulging and endplate osteophytes. Mild bilateral facet hypertrophy. There is mild osseous foraminal narrowing bilaterally. IMPRESSION: 1. Moderate size left extraforaminal disc extrusion at L4-5 with resulting left L4 nerve root encroachment. 2. 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 There is resulting mild multifactorial spinal stenosis at L3-4 and L4-5. 3. Chronic degenerative disc disease at L5-S1 with endplate osteophytes and facet hypertrophy contributing to chronic osseous foraminal narrowing bilaterally. Electronically Signed   By: Richardean Sale M.D.   On: 02/10/2015 17:44   Ct Renal Stone Study  02/04/2015  CLINICAL DATA:  Left flank pain for 2 weeks, worsening today. Pain extending to the left leg. EXAM: CT ABDOMEN AND PELVIS WITHOUT CONTRAST TECHNIQUE: Multidetector CT imaging of the abdomen and pelvis was performed following the standard protocol without IV contrast. COMPARISON:  01/16/2012 FINDINGS: Lung bases:  Clear.  Heart normal in size. Liver:  Fatty infiltration.  No mass or focal lesion. Spleen, gallbladder, pancreas, adrenal glands:  Normal. Kidneys, ureters, bladder: No renal masses. No renal or ureteral stones. No hydronephrosis. Ureters normal in course and caliber. Bladder is unremarkable. Lymph nodes:  No adenopathy. Ascites:  None. Gastrointestinal: There scattered colonic  diverticula with no evidence of diverticulitis. Colon otherwise unremarkable. Normal stomach and small bowel. Musculoskeletal: There are degenerative changes of the visualized spine. No osteoblastic or osteolytic lesions. IMPRESSION: 1. No acute findings. No evidence of renal or ureteral stones or obstructive uropathy. 2. Hepatic steatosis. Degenerative changes noted throughout the visualized spine. Scattered colonic diverticula without diverticulitis. Electronically Signed   By: Lajean Manes M.D.   On: 02/04/2015 10:48     Orlie Dakin, MD 02/10/15 [id number]

## 2015-02-10 NOTE — ED Notes (Signed)
Notified MRI pt is ready for transport

## 2015-02-10 NOTE — Consult Note (Signed)
NAME:  [name], BETTEN NO.:  [id number]  MEDICAL RECORD NO.:  [id number]  LOCATION:  A05C                         FACILITY:  [facility]  PHYSICIAN:  Leeroy Cha, M.D.   DATE OF BIRTH:  1950/04/24  DATE OF CONSULTATION: DATE OF DISCHARGE:                                CONSULTATION   This is a Neurosurgical evaluation.  Mr. Hinzman is a gentleman, who has a long history of lower back pain.  He is fully aware that he has a degenerative disc disease, but about 2 weeks ago, he has had a sudden onset of back pain _with radiation_________ to the left groin, into the hip and then burning sensation into the left leg.  He called Dr. Vertell Limber after he had an outpatient emergency CT.  He was scheduled to have an MRI of the lumbar spine, but he was unable to lie down because of the pain.  He went home and today he show up because __of ________ worsening of the pain.  He denies any pain in the right leg. The pain goes down to the left leg, all the way down to the ankle associated with a burning sensation.  Clinically, he has some weakness of the quadriceps and really mild weakness of dorsiflexion of the left foot.  It is difficult to evaluate because of burning feet that he is having.  His straight leg raising is positive.  Reflexes 1+. sensation __________ normal. He has a scar anteriorly in the neck.  Anterior cervical fusion done about 3 months ago.  The MRI showed that he has a large herniated disc at L4-5 affecting the left L4 nerve root in the extraforaminal space.  Also, he has a degenerative disease of multiple level.  I talked to him about being admitted until he is seen by Dr. Vertell Limber early the next week or to go home.  Now that he knows the diagnosis, he want to go home.  I gave him oxycodone, gabapentin, and cortisone to be taken by mouth.  He is to call Monday morning to be seen by Dr. Vertell Limber at his convenience.          ______________________________ Leeroy Cha, M.D.     EB/MEDQ  D:  02/10/2015  T:  02/10/2015  Job:  [id number]

## 2015-02-10 NOTE — ED Provider Notes (Signed)
CSN: [id number]     Arrival date & time 02/10/15  1454 History   First MD Initiated Contact with Patient 02/10/15 1511     Chief Complaint  Patient presents with  . Flank Pain    HPI  [name] is an 64 y.o. male with h/o chronic back pain, degenerative disc dz, diverticulitis, GERD, HTN, HLD who presents to the ED for evaluation of intractable back pain. He states that he has chronic back pain but the past 3 weeks he has had L flank pain that is significantly worse than baseline. States it radiates to his LLQ of his abdomen. He was seen in the ED ten days ago for same complaint with concerns for a kidney stone. Workup including CT renal study was negative at that time. He was scheduled for outpatient L-spine MRI today but is unable to lay flat due to his pain. He states he last took Norco at 11 AM today but continues to have significant pain. He states he can only lay on his R side. States that he was sent to the ED for IV pain meds so he can tolerate MRI. He denies any new symptoms. Denies new weakness, numbness, bowel/bladder incontinence, dysuria. Denies fever, chills, chest pain, SOB. Denies new injury or trauma.   Past Medical History  Diagnosis Date  . Asthma   . Diverticulitis   . Gastric ulcer   . Elevated blood pressure 03/02/13    (x) 2 years. Referral to cardiology  . SOB (shortness of breath)   . Chronic back pain     On celebrex  . Chronic neck pain     On celebrex  . Midsternal chest pain     Has had for a while, which he contributed to hiatal hernia and peptic disease. No radiation, not sure about duration, not associated with diaphoresis.  . Dizziness     occational  . DOE (dyspnea on exertion)     occasional. Now can also feel when he is at rest. Former smoker.  . Peptic disease   . Hiatal hernia   . Chest discomfort     Could be related to hiatal hernia but because of Hx of HTN, HLD and + FHX, an exercise stress test & referral to cardiology will be arranged.  .  Esophageal reflux   . Benign essential hypertension     Low salt diet. Systolic goal [id number], diastolic goal 0000000.  Marland Kitchen Hyperlipidemia     Last FLP in 2012 slightly elevated. LDL goal <100.  Marland Kitchen Allergic rhinitis, cause unspecified   . Cervicalgia   . Pain in joint, site unspecified   . Sleep disturbance, unspecified   . Chronic insomnia   . Abdominal pain   . Dyspepsia   . Diarrhea   . Blood in stool   . Tremor     Per Dr. [name]  . 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 [name]  . Diverticulosis   . Carpal tunnel syndrome, bilateral   . HOH (hard of hearing)   . Sleep apnea syndrome     was to try cpap-did not get one-repeat studies could not sleep  . Complication of anesthesia     woke up slowly after deviated septum surg. [facility].   . Cancer ([place])     basal cell removed several places   . Degenerative disk disease     degenerative in cervical area & lumbar area  . Kidney  stones     passed spontaneously  . Ventral hernia     never corrected   . 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 Appendectomy    . Nasal septum surgery    . Colonoscopy    . Upper gi endoscopy    . Carpal tunnel release  4/14    rt  . Carpal tunnel release Left 05/17/2013    Procedure: LEFT CARPAL TUNNEL RELEASE;  Surgeon: Cammie Sickle., MD;  Location: [facility];  Service: Orthopedics;  Laterality: Left;  Marland Kitchen Eye surgery    . Retinal detachment surgery  06/2014    /w Dr. Baird Cancer  . Anterior cervical decomp/discectomy fusion N/A 11/24/2014    Procedure: Cervical 4-5 Cervical 5-6 Cervical 6-7 Anterior cervical decompression/diskectomy/fusion;  Surgeon: Erline Levine, MD;  Location: [facility];  Service: Neurosurgery;  Laterality: N/A;  C4-5 C5-6 C6-7 Anterior cervical decompression/diskectomy/fusion   Family History  Problem Relation Age of  Onset  . CAD    . Heart attack Father     Deceased, 78  . CAD Brother   . Hypertension Brother   . Hyperlipidemia Brother   . Alzheimer's disease Mother     Deceased, 63  . Hyperlipidemia Sister   . Hypertension Sister    Social History  Substance Use Topics  . Smoking status: Former Smoker -- 0.75 packs/day for 15 years    Types: Cigarettes    Quit date: 03/25/1991  . Smokeless tobacco: Never Used  . Alcohol Use: 1.2 oz/week    1 Glasses of wine, 1 Cans of beer per week     Comment: 1-2 drinks per night    Review of Systems  All other systems reviewed and are negative.     Allergies  Review of patient's allergies indicates no known allergies.  Home Medications   Prior to Admission medications   Medication Sig Start Date End Date Taking? Authorizing Provider  albuterol (PROVENTIL HFA;VENTOLIN HFA) 108 (90 BASE) MCG/ACT inhaler Inhale 2 puffs into the lungs at bedtime as needed for wheezing or shortness of breath (cough).    Historical Provider, MD  aspirin 81 MG tablet Take 81 mg by mouth daily.    Historical Provider, MD  atorvastatin (LIPITOR) 20 MG tablet Take 20 mg by mouth at bedtime.     Historical Provider, MD  celecoxib (CELEBREX) 200 MG capsule Take 200 mg by mouth 2 (two) times daily.    Historical Provider, MD  cholecalciferol (VITAMIN D) 1000 UNITS tablet Take 1,000 Units by mouth daily.    Historical Provider, MD  cimetidine (TAGAMET) 200 MG tablet Take 200 mg by mouth at bedtime.    Historical Provider, MD  cyclobenzaprine (FLEXERIL) 10 MG tablet Take 10 mg by mouth at bedtime. For back pain    Historical Provider, MD  esomeprazole (NEXIUM) 40 MG capsule Take 40 mg by mouth daily with breakfast.     Historical Provider, MD  fluticasone (FLONASE) 50 MCG/ACT nasal spray Place 1 spray into both nostrils daily as needed for allergies or rhinitis (congestion).    Historical Provider, MD  gabapentin (NEURONTIN) 400 MG capsule Take 800 mg by mouth 2 (two) times  daily.    Historical Provider, MD  GLUCOS-CHONDROIT-HYALURON-MSM PO Take 1 tablet by mouth 2 (two) times daily. Glucosamine 1500 with chondroiton, hyaluronic acid, msm    Historical Provider, MD  HYDROcodone-acetaminophen (NORCO/VICODIN) 5-325 MG per tablet Take 1 tablet by mouth See admin instructions. Take 1 tablet by mouth daily at bedtime and in the middle of the night, may take an additional tablet during the day as needed for pain    Historical Provider, MD  KRILL OIL PO Take 350 mg by mouth daily.    Historical Provider, MD  lisinopril-hydrochlorothiazide (PRINZIDE,ZESTORETIC) 10-12.5 MG per tablet Take 1 tablet by mouth daily.    Historical Provider, MD  loratadine (CLARITIN) 10 MG tablet Take 10 mg by mouth daily.     Historical Provider, MD  Lutein 20 MG CAPS Take 20 mg by mouth daily.    Historical Provider, MD  naproxen (NAPROSYN) 500 MG tablet Take 1 tablet (500 mg total) by mouth 2 (two) times daily. 02/04/15   Gareth Morgan, MD  Probiotic Product (ALIGN) 4 MG CAPS Take 4 mg by mouth daily.     Historical Provider, MD  tamsulosin (FLOMAX) 0.4 MG CAPS capsule Take 0.4 mg by mouth daily with breakfast. 10/31/14   Historical Provider, MD  traZODone (DESYREL) 150 MG tablet Take 150 mg by mouth at bedtime. For sleep    Historical Provider, MD  zolpidem (AMBIEN) 10 MG tablet Take 10 mg by mouth at bedtime.    Historical Provider, MD   BP 115/74 mmHg  Pulse 99  Temp(Src) 98.4 F (36.9 C) (Oral)  Resp 15  Ht 5\' 11"  (1.803 m)  Wt 225 lb (102.059 kg)  BMI 31.39 kg/m2  SpO2 93% Physical Exam  Constitutional: He is oriented to person, place, and time. No distress.  HENT:  Right Ear: External ear normal.  Left Ear: External ear normal.  Nose: Nose normal.  Mouth/Throat: Oropharynx is clear and moist. No oropharyngeal exudate.  Eyes: Conjunctivae and EOM are normal. Pupils are equal, round, and reactive to light.  Neck: Normal range of motion. Neck supple.  Cardiovascular: Normal rate,  regular rhythm, normal heart sounds and intact distal pulses.   Pulmonary/Chest: Effort normal and breath sounds normal. No respiratory distress. He has no wheezes.  Abdominal: Soft. Bowel sounds are normal. He exhibits no distension. There is tenderness in the left lower quadrant. There is no rigidity, no guarding and no CVA tenderness.  Musculoskeletal: He exhibits no edema.  L flank and paraspinal muscles tender. No spinal tenderness. UE and LE strength and sensation intact.   Neurological: He is alert and oriented to person, place, and time. No cranial nerve deficit. Coordination normal.  Skin: Skin is warm and dry. He is not diaphoretic.  Psychiatric: He has a normal mood and affect.  Nursing note and vitals reviewed.   ED Course  Procedures (including critical care time) Labs Review Labs Reviewed - No data to display  Imaging Review Mr Lumbar Spine Wo Contrast  02/10/2015  CLINICAL DATA:  Low back pain radiating into the left flank, left lower quadrant and left leg for 2 weeks. Initial encounter. EXAM: MRI LUMBAR SPINE WITHOUT CONTRAST TECHNIQUE: Multiplanar, multisequence MR imaging of the lumbar spine was performed. No intravenous contrast was administered. COMPARISON:  Abdominal CT 02/04/2015 and 01/16/2012. FINDINGS: Segmentation: Conventional anatomy assumed, with the last open disc space designated L5-S1. Alignment: Stable with mild straightening. No focal angulation or listhesis. Bones: No worrisome osseous lesion, acute fracture or pars defect. There are Schmorl's nodes throughout the lumbar spine with endplate degenerative changes at L4-5 and L5-S1. The lumbar pedicles are short on a congenital basis. Conus medullaris: Extends to the L1 level and appears  normal. Paraspinal and other soft tissues: No significant paraspinal findings. Disc levels: L1-2: Loss of disc height with mild annular disc bulging, paraspinal osteophyte formation and facet hypertrophy. No significant spinal  stenosis or nerve root encroachment. L2-3: Mild loss of disc height with mild annular bulging. No disc herniation, spinal stenosis or nerve root encroachment. L3-4: Loss of disc height with annular disc bulging, facet and ligamentous hypertrophy, contributing to mild spinal stenosis. There is mild narrowing of the lateral recesses. The foramina appear sufficiently patent. L4-5: Loss of disc height with annular disc bulging, facet and ligamentous hypertrophy. There is a superimposed moderate-sized extraforaminal disc extrusion on the left, best seen on the sagittal images. This causes left L4 nerve root encroachment. There is underlying mild spinal stenosis with mild narrowing of the lateral recesses and foramina bilaterally. L5-S1: Chronic degenerative disc disease with loss of disc height, disc bulging and endplate osteophytes. Mild bilateral facet hypertrophy. There is mild osseous foraminal narrowing bilaterally. IMPRESSION: 1. Moderate size left extraforaminal disc extrusion at L4-5 with resulting left L4 nerve root encroachment. 2. 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 There is resulting mild multifactorial spinal stenosis at L3-4 and L4-5. 3. Chronic degenerative disc disease at L5-S1 with endplate osteophytes and facet hypertrophy contributing to chronic osseous foraminal narrowing bilaterally. Electronically Signed   By: Richardean Sale M.D.   On: 02/10/2015 17:44   I have personally reviewed and evaluated these images and lab results as part of my medical decision-making.   EKG Interpretation None      MDM   Final diagnoses:  Back pain  Lumbar radiculopathy    No lab workup indicated at this time. Confirmed with neurosurgeon that they need MR lumbar spine without con. Will give dilaudid now. MRI ordered.  MRI shows significant disc extrusion at L4-L5 area. Pt continues to endorse severe pain. Discussed with neuro on call who evaluated pt. Pt does not want to be  admitted at this time. Neuro will provide pain med rx and muscle relaxant. Pt to f/u outpatient. Strict return precautions given. Pt verbalized understanding.     Anne Ng, PA-C 02/11/15 2134  Orlie Dakin, MD 02/11/15 2358

## 2015-02-10 NOTE — ED Notes (Signed)
Pt from neuro center. Pt was supposed to have MRI, however he was in severe pain and couldn't lay flat for exam. Sent here by EMS. The past two weeks pt has had left flank and rib pain that radiates down to left leg. Pt has chronic back pain DDD. Pt thinks he may have kidney stone. BP 180/80, HR 104, 97% RA

## 2015-02-10 NOTE — Discharge Instructions (Signed)
Please follow up with Dr. Vertell Limber as scheduled. You may take the prescriptions that Dr. Joya Salm gave you to help you with the pain. Return to the ER for new or worsening symptoms such as high fever, bowel/bladder incontinence, weakness, etc.   Please obtain all of your results from medical records or have your doctors office obtain the results - share them with your doctor - you should be seen at your doctors office in the next 2 days. Call today to arrange your follow up. Take the medications as prescribed. Please review all of the medicines and only take them if you do not have an allergy to them. Please be aware that if you are taking birth control pills, taking other prescriptions, ESPECIALLY ANTIBIOTICS may make the birth control ineffective - if this is the case, either do not engage in sexual activity or use alternative methods of birth control such as condoms until you have finished the medicine and your family doctor says it is OK to restart them. If you are on a blood thinner such as COUMADIN, be aware that any other medicine that you take may cause the coumadin to either work too much, or not enough - you should have your coumadin level rechecked in next 7 days if this is the case.  ?  It is also a possibility that you have an allergic reaction to any of the medicines that you have been prescribed - Everybody reacts differently to medications and while MOST people have no trouble with most medicines, you may have a reaction such as nausea, vomiting, rash, swelling, shortness of breath. If this is the case, please stop taking the medicine immediately and contact your physician.  ?  You should return to the ER if you develop severe or worsening symptoms.

## 2015-11-23 ENCOUNTER — Other Ambulatory Visit: Payer: Self-pay | Admitting: Neurosurgery

## 2015-12-17 ENCOUNTER — Encounter (HOSPITAL_COMMUNITY): Payer: Self-pay

## 2015-12-17 NOTE — Pre-Procedure Instructions (Signed)
[name]  12/17/2015      [id number] - [place], [place] - [address] [place] [address] Phone: [phone] Fax: [phone]    Your procedure is scheduled on Tuesday October 3.  Report to [facility] Admitting at 8:15 A.M.  Call this number if you have problems the morning of surgery:  [phone]   Remember:  Do not eat food or drink liquids after midnight.  Take these medicines the morning of surgery with A SIP OF WATER: esomeprazole (Nexium), gabapentin (neurontin), hydrocodone (norco) if needed, tizanidine (Zanaflex), loratadine (claritin), albuterol inhaler if needed (please bring to hospital day of surgery), flonase if needed, tamsulosin (flomax)  7 days prior to surgery STOP taking any Aspirin, celebrex (celecoxib), Aleve, Naproxen, Ibuprofen, Motrin, Advil, Goody's, BC's, all herbal medications, fish oil, and all vitamins    Do not wear jewelry  Do not wear lotions, powders, or colognes, or deoderant.  Men may shave face and neck.  Do not bring valuables to the hospital.  [facility] is not responsible for any belongings or valuables.  Contacts, dentures or bridgework may not be worn into surgery.  Leave your suitcase in the car.  After surgery it may be brought to your room.  For patients admitted to the hospital, discharge time will be determined by your treatment team.  Patients discharged the day of surgery will not be allowed to drive home.    Special instructions:    [facility]- Preparing For Surgery  Before surgery, you can play an important role. Because skin is not sterile, your skin needs to be as free of germs as possible. You can reduce the number of germs on your skin by washing with CHG (chlorahexidine gluconate) Soap before surgery.  CHG is an antiseptic cleaner which kills germs and bonds with the skin to continue killing germs even after washing.  Please do not use if you  have an allergy to CHG or antibacterial soaps. If your skin becomes reddened/irritated stop using the CHG.  Do not shave (including legs and underarms) for at least 48 hours prior to first CHG shower. It is OK to shave your face.  Please follow these instructions carefully.   1. Shower the NIGHT BEFORE SURGERY and the MORNING OF SURGERY with CHG.   2. If you chose to wash your hair, wash your hair first as usual with your normal shampoo.  3. After you shampoo, rinse your hair and body thoroughly to remove the shampoo.  4. Use CHG as you would any other liquid soap. You can apply CHG directly to the skin and wash gently with a scrungie or a clean washcloth.   5. Apply the CHG Soap to your body ONLY FROM THE NECK DOWN.  Do not use on open wounds or open sores. Avoid contact with your eyes, ears, mouth and genitals (private parts). Wash genitals (private parts) with your normal soap.  6. Wash thoroughly, paying special attention to the area where your surgery will be performed.  7. Thoroughly rinse your body with warm water from the neck down.  8. DO NOT shower/wash with your normal soap after using and rinsing off the CHG Soap.  9. Pat yourself dry with a CLEAN TOWEL.   10. Wear CLEAN PAJAMAS   11. Place CLEAN SHEETS on your bed the night of your first shower and DO NOT SLEEP WITH PETS.    Day of  Surgery: Do not apply any deodorants/lotions. Please wear clean clothes to the hospital/surgery center.      Please read over the following fact sheets that you were given. MRSA Information

## 2015-12-18 ENCOUNTER — Encounter (HOSPITAL_COMMUNITY): Payer: Self-pay

## 2015-12-18 ENCOUNTER — Encounter (HOSPITAL_COMMUNITY)
Admission: RE | Admit: 2015-12-18 | Discharge: 2015-12-18 | Disposition: A | Discharge status: Home / Self Care | Payer: 59 | Source: Ambulatory Visit | Attending: Neurosurgery | Admitting: Neurosurgery

## 2015-12-18 DIAGNOSIS — Z01812 Encounter for preprocedural laboratory examination: Secondary | ICD-10-CM | POA: Diagnosis not present

## 2015-12-18 HISTORY — DX: Personal history of urinary calculi: Z87.442

## 2015-12-18 LAB — BASIC METABOLIC PANEL
Anion gap: 8 (ref 5–15)
BUN: 16 mg/dL (ref 6–20)
CALCIUM: 9.2 mg/dL (ref 8.9–10.3)
CO2: 25 mmol/L (ref 22–32)
Chloride: 105 mmol/L (ref 101–111)
Creatinine, Ser: 0.77 mg/dL (ref 0.61–1.24)
GFR calc Af Amer: >60 mL/min (ref >60)
GLUCOSE: 134 mg/dL — AB (ref 65–99)
Potassium: 3.4 mmol/L — ABNORMAL LOW (ref 3.5–5.1)
Sodium: 138 mmol/L (ref 135–145)

## 2015-12-18 LAB — TYPE AND SCREEN
ABO/RH(D): O POS
Antibody Screen: NEGATIVE

## 2015-12-18 LAB — CBC
HCT: 40.8 % (ref 39.0–52.0)
Hemoglobin: 13 g/dL (ref 13.0–17.0)
MCH: 28.1 pg (ref 26.0–34.0)
MCHC: 31.9 g/dL (ref 30.0–36.0)
MCV: 88.3 fL (ref 78.0–100.0)
PLATELETS: 236 10*3/uL (ref 150–400)
RBC: 4.62 MIL/uL (ref 4.22–5.81)
RDW: 13.3 % (ref 11.5–15.5)
WBC: 7.4 10*3/uL (ref 4.0–10.5)

## 2015-12-18 LAB — SURGICAL PCR SCREEN
MRSA, PCR: NEGATIVE
STAPHYLOCOCCUS AUREUS: NEGATIVE

## 2015-12-18 LAB — ABO/RH: ABO/RH(D): O POS

## 2015-12-19 NOTE — Progress Notes (Addendum)
Re-requested Sleep study,stress test,and last office visit from [name] at [facility]. Per [facility] no stress test on file.

## 2015-12-24 NOTE — Progress Notes (Signed)
Call to [facility], on service for midday break, will try later.

## 2015-12-25 ENCOUNTER — Encounter (HOSPITAL_COMMUNITY)
Admission: RE | Disposition: A | Discharge status: Home / Self Care | Payer: Self-pay | Source: Ambulatory Visit | Attending: Neurosurgery

## 2015-12-25 ENCOUNTER — Inpatient Hospital Stay (HOSPITAL_COMMUNITY): Payer: 59 | Admitting: Anesthesiology

## 2015-12-25 ENCOUNTER — Inpatient Hospital Stay (HOSPITAL_COMMUNITY)
Admission: RE | Admit: 2015-12-25 | Discharge: 2015-12-27 | DRG: 460 | Disposition: A | Discharge status: Home / Self Care | Payer: 59 | Source: Ambulatory Visit | Attending: Neurosurgery | Admitting: Neurosurgery

## 2015-12-25 ENCOUNTER — Encounter (HOSPITAL_COMMUNITY): Payer: Self-pay | Admitting: Urology

## 2015-12-25 ENCOUNTER — Inpatient Hospital Stay (HOSPITAL_COMMUNITY): Payer: 59

## 2015-12-25 DIAGNOSIS — Z79899 Other long term (current) drug therapy: Secondary | ICD-10-CM | POA: Diagnosis not present

## 2015-12-25 DIAGNOSIS — I10 Essential (primary) hypertension: Secondary | ICD-10-CM | POA: Diagnosis present

## 2015-12-25 DIAGNOSIS — M4726 Other spondylosis with radiculopathy, lumbar region: Secondary | ICD-10-CM | POA: Diagnosis present

## 2015-12-25 DIAGNOSIS — M549 Dorsalgia, unspecified: Secondary | ICD-10-CM | POA: Diagnosis present

## 2015-12-25 DIAGNOSIS — M48062 Spinal stenosis, lumbar region with neurogenic claudication: Secondary | ICD-10-CM | POA: Diagnosis present

## 2015-12-25 DIAGNOSIS — K449 Diaphragmatic hernia without obstruction or gangrene: Secondary | ICD-10-CM | POA: Diagnosis present

## 2015-12-25 DIAGNOSIS — G4730 Sleep apnea, unspecified: Secondary | ICD-10-CM | POA: Diagnosis present

## 2015-12-25 DIAGNOSIS — Z419 Encounter for procedure for purposes other than remedying health state, unspecified: Secondary | ICD-10-CM

## 2015-12-25 DIAGNOSIS — Z87891 Personal history of nicotine dependence: Secondary | ICD-10-CM

## 2015-12-25 DIAGNOSIS — M5116 Intervertebral disc disorders with radiculopathy, lumbar region: Principal | ICD-10-CM | POA: Diagnosis present

## 2015-12-25 DIAGNOSIS — K219 Gastro-esophageal reflux disease without esophagitis: Secondary | ICD-10-CM | POA: Diagnosis present

## 2015-12-25 HISTORY — PX: ANTERIOR LAT LUMBAR FUSION: SHX1168

## 2015-12-25 HISTORY — PX: LUMBAR PERCUTANEOUS PEDICLE SCREW 2 LEVEL: SHX5561

## 2015-12-25 SURGERY — ANTERIOR LATERAL LUMBAR FUSION 2 LEVELS
Anesthesia: General | Laterality: Right

## 2015-12-25 MED ORDER — PHENYLEPHRINE HCL 10 MG/ML IJ SOLN
INTRAVENOUS | Status: DC | PRN
  Administered 2015-12-25: 50 ug/min via INTRAVENOUS

## 2015-12-25 MED ORDER — ALBUTEROL SULFATE (2.5 MG/3ML) 0.083% IN NEBU: 3.0000 mL | INHALATION_SOLUTION | Freq: Every evening | RESPIRATORY_TRACT | Status: DC | PRN

## 2015-12-25 MED ORDER — FENTANYL CITRATE (PF) 100 MCG/2ML IJ SOLN
INTRAMUSCULAR | Status: AC
  Filled 2015-12-25: qty 2

## 2015-12-25 MED ORDER — SENNOSIDES-DOCUSATE SODIUM 8.6-50 MG PO TABS: 1.0000 | ORAL_TABLET | Freq: Every evening | ORAL | Status: DC | PRN

## 2015-12-25 MED ORDER — ALUM & MAG HYDROXIDE-SIMETH 200-200-20 MG/5ML PO SUSP: 30.0000 mL | Freq: Four times a day (QID) | ORAL | Status: DC | PRN

## 2015-12-25 MED ORDER — LORATADINE 10 MG PO TABS
10.0000 mg | ORAL_TABLET | Freq: Every day | ORAL | Status: DC
  Administered 2015-12-25 – 2015-12-26 (×2): 10 mg via ORAL
  Filled 2015-12-25 (×2): qty 1

## 2015-12-25 MED ORDER — SODIUM CHLORIDE 0.9% FLUSH: 3.0000 mL | INTRAVENOUS | Status: DC | PRN

## 2015-12-25 MED ORDER — OXYCODONE-ACETAMINOPHEN 5-325 MG PO TABS
1.0000 | ORAL_TABLET | ORAL | Status: DC | PRN
  Administered 2015-12-25 – 2015-12-26 (×6): 2 via ORAL
  Filled 2015-12-25 (×5): qty 2

## 2015-12-25 MED ORDER — PANTOPRAZOLE SODIUM 40 MG PO TBEC
40.0000 mg | DELAYED_RELEASE_TABLET | Freq: Every day | ORAL | Status: DC
  Administered 2015-12-26: 40 mg via ORAL
  Filled 2015-12-25: qty 1

## 2015-12-25 MED ORDER — PROPOFOL 10 MG/ML IV BOLUS
INTRAVENOUS | Status: DC | PRN
  Administered 2015-12-25: 200 mg via INTRAVENOUS

## 2015-12-25 MED ORDER — GLYCOPYRROLATE 0.2 MG/ML IV SOSY
PREFILLED_SYRINGE | INTRAVENOUS | Status: AC
  Filled 2015-12-25: qty 3

## 2015-12-25 MED ORDER — EPHEDRINE SULFATE 50 MG/ML IJ SOLN
INTRAMUSCULAR | Status: DC | PRN
  Administered 2015-12-25: 10 mg via INTRAVENOUS

## 2015-12-25 MED ORDER — PROPOFOL 10 MG/ML IV BOLUS
INTRAVENOUS | Status: AC
  Filled 2015-12-25: qty 40

## 2015-12-25 MED ORDER — ACETAMINOPHEN 650 MG RE SUPP: 650.0000 mg | RECTAL | Status: DC | PRN

## 2015-12-25 MED ORDER — ROCURONIUM BROMIDE 10 MG/ML (PF) SYRINGE
PREFILLED_SYRINGE | INTRAVENOUS | Status: AC
Start: 2015-12-25 — End: 2015-12-25
  Filled 2015-12-25: qty 20

## 2015-12-25 MED ORDER — ARTIFICIAL TEARS OP OINT
TOPICAL_OINTMENT | OPHTHALMIC | Status: DC | PRN
  Administered 2015-12-25: 1 via OPHTHALMIC

## 2015-12-25 MED ORDER — FENTANYL CITRATE (PF) 100 MCG/2ML IJ SOLN
INTRAMUSCULAR | Status: DC | PRN
  Administered 2015-12-25: 25 ug via INTRAVENOUS
  Administered 2015-12-25: 50 ug via INTRAVENOUS
  Administered 2015-12-25: 200 ug via INTRAVENOUS
  Administered 2015-12-25: 25 ug via INTRAVENOUS

## 2015-12-25 MED ORDER — HYDROCODONE-ACETAMINOPHEN 7.5-325 MG PO TABS: 1.0000 | ORAL_TABLET | Freq: Once | ORAL | Status: DC | PRN

## 2015-12-25 MED ORDER — HYDROCODONE-ACETAMINOPHEN 5-325 MG PO TABS: 1.0000 | ORAL_TABLET | Freq: Three times a day (TID) | ORAL | Status: DC

## 2015-12-25 MED ORDER — LISINOPRIL-HYDROCHLOROTHIAZIDE 10-12.5 MG PO TABS: 1.0000 | ORAL_TABLET | Freq: Every day | ORAL | Status: DC

## 2015-12-25 MED ORDER — CELECOXIB 200 MG PO CAPS
200.0000 mg | ORAL_CAPSULE | Freq: Every morning | ORAL | Status: DC
  Filled 2015-12-25: qty 1

## 2015-12-25 MED ORDER — BISACODYL 10 MG RE SUPP: 10.0000 mg | Freq: Every day | RECTAL | Status: DC | PRN

## 2015-12-25 MED ORDER — ONDANSETRON HCL 4 MG/2ML IJ SOLN
4.0000 mg | INTRAMUSCULAR | Status: DC | PRN
  Administered 2015-12-25: 4 mg via INTRAVENOUS
  Filled 2015-12-25: qty 2

## 2015-12-25 MED ORDER — FENTANYL CITRATE (PF) 100 MCG/2ML IJ SOLN
INTRAMUSCULAR | Status: AC
  Filled 2015-12-25: qty 4

## 2015-12-25 MED ORDER — HYDROMORPHONE HCL 1 MG/ML IJ SOLN
INTRAMUSCULAR | Status: AC
  Filled 2015-12-25: qty 1

## 2015-12-25 MED ORDER — TIZANIDINE HCL 4 MG PO TABS
2.0000 mg | ORAL_TABLET | Freq: Two times a day (BID) | ORAL | Status: DC
  Administered 2015-12-25 – 2015-12-26 (×3): 2 mg via ORAL
  Filled 2015-12-25 (×3): qty 1

## 2015-12-25 MED ORDER — METHOCARBAMOL 500 MG PO TABS
500.0000 mg | ORAL_TABLET | Freq: Four times a day (QID) | ORAL | Status: DC | PRN
  Administered 2015-12-25 – 2015-12-27 (×5): 500 mg via ORAL
  Filled 2015-12-25 (×5): qty 1

## 2015-12-25 MED ORDER — FAMOTIDINE 20 MG PO TABS
20.0000 mg | ORAL_TABLET | Freq: Two times a day (BID) | ORAL | Status: DC
  Administered 2015-12-25 – 2015-12-26 (×3): 20 mg via ORAL
  Filled 2015-12-25 (×3): qty 1

## 2015-12-25 MED ORDER — CHLORHEXIDINE GLUCONATE CLOTH 2 % EX PADS: 6.0000 | MEDICATED_PAD | Freq: Once | CUTANEOUS | Status: DC

## 2015-12-25 MED ORDER — METHYLPREDNISOLONE ACETATE 80 MG/ML IJ SUSP
INTRAMUSCULAR | Status: AC
  Filled 2015-12-25: qty 1

## 2015-12-25 MED ORDER — TAMSULOSIN HCL 0.4 MG PO CAPS
0.4000 mg | ORAL_CAPSULE | Freq: Every day | ORAL | Status: DC
  Administered 2015-12-25 – 2015-12-27 (×2): 0.4 mg via ORAL
  Filled 2015-12-25 (×2): qty 1

## 2015-12-25 MED ORDER — HYDROCODONE-ACETAMINOPHEN 5-325 MG PO TABS: 1.0000 | ORAL_TABLET | ORAL | Status: DC | PRN

## 2015-12-25 MED ORDER — LIDOCAINE HCL (CARDIAC) 20 MG/ML IV SOLN
INTRAVENOUS | Status: DC | PRN
  Administered 2015-12-25: 75 mg via INTRAVENOUS

## 2015-12-25 MED ORDER — HYDROMORPHONE HCL 1 MG/ML IJ SOLN
0.2500 mg | INTRAMUSCULAR | Status: DC | PRN
  Administered 2015-12-25 (×4): 0.5 mg via INTRAVENOUS

## 2015-12-25 MED ORDER — MIDAZOLAM HCL 2 MG/2ML IJ SOLN
INTRAMUSCULAR | Status: AC
Start: 2015-12-25 — End: 2015-12-25
  Filled 2015-12-25: qty 2

## 2015-12-25 MED ORDER — LIDOCAINE-EPINEPHRINE 1 %-1:100000 IJ SOLN
INTRAMUSCULAR | Status: AC
  Filled 2015-12-25: qty 1

## 2015-12-25 MED ORDER — HYDROMORPHONE HCL 1 MG/ML IJ SOLN
0.5000 mg | INTRAMUSCULAR | Status: DC | PRN
  Administered 2015-12-25 – 2015-12-26 (×2): 1 mg via INTRAVENOUS
  Filled 2015-12-25 (×2): qty 1

## 2015-12-25 MED ORDER — LISINOPRIL 20 MG PO TABS
10.0000 mg | ORAL_TABLET | Freq: Every day | ORAL | Status: DC
  Administered 2015-12-26: 10 mg via ORAL
  Filled 2015-12-25: qty 1

## 2015-12-25 MED ORDER — RISAQUAD PO CAPS
1.0000 | ORAL_CAPSULE | Freq: Every day | ORAL | Status: DC
  Administered 2015-12-25 – 2015-12-26 (×2): 1 via ORAL
  Filled 2015-12-25 (×3): qty 1

## 2015-12-25 MED ORDER — KCL IN DEXTROSE-NACL 20-5-0.45 MEQ/L-%-% IV SOLN: INTRAVENOUS | Status: DC

## 2015-12-25 MED ORDER — SUCCINYLCHOLINE CHLORIDE 20 MG/ML IJ SOLN
INTRAMUSCULAR | Status: DC | PRN
  Administered 2015-12-25: 120 mg via INTRAVENOUS

## 2015-12-25 MED ORDER — LACTATED RINGERS IV SOLN
INTRAVENOUS | Status: DC
  Administered 2015-12-25: 09:00:00 via INTRAVENOUS

## 2015-12-25 MED ORDER — DOCUSATE SODIUM 100 MG PO CAPS
100.0000 mg | ORAL_CAPSULE | Freq: Two times a day (BID) | ORAL | Status: DC
  Administered 2015-12-25 – 2015-12-26 (×3): 100 mg via ORAL
  Filled 2015-12-25 (×3): qty 1

## 2015-12-25 MED ORDER — ZOLPIDEM TARTRATE 5 MG PO TABS
10.0000 mg | ORAL_TABLET | Freq: Every day | ORAL | Status: DC
  Administered 2015-12-25 – 2015-12-26 (×2): 10 mg via ORAL
  Filled 2015-12-25 (×2): qty 2

## 2015-12-25 MED ORDER — ATORVASTATIN CALCIUM 20 MG PO TABS
20.0000 mg | ORAL_TABLET | Freq: Every evening | ORAL | Status: DC
  Administered 2015-12-25 – 2015-12-26 (×2): 20 mg via ORAL
  Filled 2015-12-25 (×2): qty 1

## 2015-12-25 MED ORDER — FAMOTIDINE 20 MG PO TABS: 20.0000 mg | ORAL_TABLET | Freq: Two times a day (BID) | ORAL | Status: DC

## 2015-12-25 MED ORDER — ONDANSETRON HCL 4 MG/2ML IJ SOLN
INTRAMUSCULAR | Status: AC
Start: 2015-12-25 — End: 2015-12-25
  Filled 2015-12-25: qty 4

## 2015-12-25 MED ORDER — CEFAZOLIN SODIUM-DEXTROSE 2-4 GM/100ML-% IV SOLN
2.0000 g | INTRAVENOUS | Status: AC
  Administered 2015-12-25: 2 g via INTRAVENOUS
  Filled 2015-12-25: qty 100

## 2015-12-25 MED ORDER — PROPOFOL 500 MG/50ML IV EMUL
INTRAVENOUS | Status: DC | PRN
  Administered 2015-12-25 (×2): via INTRAVENOUS
  Administered 2015-12-25: 55 ug/kg/min via INTRAVENOUS

## 2015-12-25 MED ORDER — ASPIRIN EC 81 MG PO TBEC
81.0000 mg | DELAYED_RELEASE_TABLET | Freq: Every day | ORAL | Status: DC
  Administered 2015-12-26: 81 mg via ORAL
  Filled 2015-12-25: qty 1

## 2015-12-25 MED ORDER — PROPOFOL 10 MG/ML IV BOLUS
INTRAVENOUS | Status: AC
  Filled 2015-12-25: qty 20

## 2015-12-25 MED ORDER — PHENYLEPHRINE HCL 10 MG/ML IJ SOLN
INTRAMUSCULAR | Status: DC | PRN
  Administered 2015-12-25: 60 ug via INTRAVENOUS

## 2015-12-25 MED ORDER — PHENYLEPHRINE HCL 10 MG/ML IJ SOLN
INTRAMUSCULAR | Status: AC
  Filled 2015-12-25: qty 1

## 2015-12-25 MED ORDER — MIDAZOLAM HCL 5 MG/5ML IJ SOLN
INTRAMUSCULAR | Status: DC | PRN
  Administered 2015-12-25 (×2): 1 mg via INTRAVENOUS

## 2015-12-25 MED ORDER — MENTHOL 3 MG MT LOZG: 1.0000 | LOZENGE | OROMUCOSAL | Status: DC | PRN

## 2015-12-25 MED ORDER — GLYCOPYRROLATE 0.2 MG/ML IJ SOLN
INTRAMUSCULAR | Status: DC | PRN
  Administered 2015-12-25: 0.1 mg via INTRAVENOUS
  Administered 2015-12-25: 0.2 mg via INTRAVENOUS

## 2015-12-25 MED ORDER — SUGAMMADEX SODIUM 500 MG/5ML IV SOLN
INTRAVENOUS | Status: AC
  Filled 2015-12-25: qty 5

## 2015-12-25 MED ORDER — GABAPENTIN 400 MG PO CAPS
800.0000 mg | ORAL_CAPSULE | Freq: Two times a day (BID) | ORAL | Status: DC
  Administered 2015-12-25 – 2015-12-26 (×3): 800 mg via ORAL
  Filled 2015-12-25 (×3): qty 2

## 2015-12-25 MED ORDER — ONDANSETRON HCL 4 MG/2ML IJ SOLN
INTRAMUSCULAR | Status: DC | PRN
  Administered 2015-12-25: 4 mg via INTRAVENOUS

## 2015-12-25 MED ORDER — PROPOFOL 10 MG/ML IV BOLUS
INTRAVENOUS | Status: AC
Start: 2015-12-25 — End: 2015-12-25
  Filled 2015-12-25: qty 20

## 2015-12-25 MED ORDER — ZOLPIDEM TARTRATE 5 MG PO TABS: 5.0000 mg | ORAL_TABLET | Freq: Every evening | ORAL | Status: DC | PRN

## 2015-12-25 MED ORDER — BUPIVACAINE HCL (PF) 0.5 % IJ SOLN
INTRAMUSCULAR | Status: DC | PRN
  Administered 2015-12-25: 10 mL
  Administered 2015-12-25: 6.5 mL

## 2015-12-25 MED ORDER — FAMOTIDINE IN NACL 20-0.9 MG/50ML-% IV SOLN: 20.0000 mg | Freq: Two times a day (BID) | INTRAVENOUS | Status: DC

## 2015-12-25 MED ORDER — CEFAZOLIN IN D5W 1 GM/50ML IV SOLN
1.0000 g | Freq: Three times a day (TID) | INTRAVENOUS | Status: AC
  Administered 2015-12-25 – 2015-12-26 (×2): 1 g via INTRAVENOUS
  Filled 2015-12-25 (×2): qty 50

## 2015-12-25 MED ORDER — HYDROCHLOROTHIAZIDE 12.5 MG PO CAPS
12.5000 mg | ORAL_CAPSULE | Freq: Every day | ORAL | Status: DC
  Administered 2015-12-26: 12.5 mg via ORAL
  Filled 2015-12-25: qty 1

## 2015-12-25 MED ORDER — LIDOCAINE-EPINEPHRINE 1 %-1:100000 IJ SOLN
INTRAMUSCULAR | Status: DC | PRN
  Administered 2015-12-25: 10 mL
  Administered 2015-12-25: 6.5 mL

## 2015-12-25 MED ORDER — FLUTICASONE PROPIONATE 50 MCG/ACT NA SUSP
1.0000 | Freq: Every day | NASAL | Status: DC | PRN
  Filled 2015-12-25: qty 16

## 2015-12-25 MED ORDER — TRAZODONE HCL 150 MG PO TABS
150.0000 mg | ORAL_TABLET | Freq: Every day | ORAL | Status: DC
  Administered 2015-12-25 – 2015-12-26 (×2): 150 mg via ORAL
  Filled 2015-12-25 (×2): qty 1

## 2015-12-25 MED ORDER — PHENOL 1.4 % MT LIQD: 1.0000 | OROMUCOSAL | Status: DC | PRN

## 2015-12-25 MED ORDER — METHOCARBAMOL 1000 MG/10ML IJ SOLN
500.0000 mg | Freq: Four times a day (QID) | INTRAVENOUS | Status: DC | PRN
  Filled 2015-12-25: qty 5

## 2015-12-25 MED ORDER — EPHEDRINE 5 MG/ML INJ
INTRAVENOUS | Status: AC
  Filled 2015-12-25: qty 10

## 2015-12-25 MED ORDER — LIDOCAINE 2% (20 MG/ML) 5 ML SYRINGE
INTRAMUSCULAR | Status: AC
Start: 2015-12-25 — End: 2015-12-25
  Filled 2015-12-25: qty 10

## 2015-12-25 MED ORDER — OXYCODONE-ACETAMINOPHEN 5-325 MG PO TABS
ORAL_TABLET | ORAL | Status: AC
  Filled 2015-12-25: qty 2

## 2015-12-25 MED ORDER — FLEET ENEMA 7-19 GM/118ML RE ENEM: 1.0000 | ENEMA | Freq: Once | RECTAL | Status: DC | PRN

## 2015-12-25 MED ORDER — BUPIVACAINE HCL (PF) 0.5 % IJ SOLN
INTRAMUSCULAR | Status: AC
  Filled 2015-12-25: qty 30

## 2015-12-25 MED ORDER — SODIUM CHLORIDE 0.9 % IV SOLN: 250.0000 mL | INTRAVENOUS | Status: DC

## 2015-12-25 MED ORDER — HYDROMORPHONE HCL 1 MG/ML IJ SOLN
0.2500 mg | INTRAMUSCULAR | Status: DC | PRN
  Administered 2015-12-25 (×2): 0.5 mg via INTRAVENOUS

## 2015-12-25 MED ORDER — VITAMIN D 1000 UNITS PO TABS
1000.0000 [IU] | ORAL_TABLET | Freq: Every day | ORAL | Status: DC
  Administered 2015-12-26: 1000 [IU] via ORAL
  Filled 2015-12-25: qty 1

## 2015-12-25 MED ORDER — PHENYLEPHRINE HCL 10 MG/ML IJ SOLN: INTRAVENOUS | Status: DC | PRN

## 2015-12-25 MED ORDER — SODIUM CHLORIDE 0.9% FLUSH
3.0000 mL | Freq: Two times a day (BID) | INTRAVENOUS | Status: DC
  Administered 2015-12-26: 3 mL via INTRAVENOUS

## 2015-12-25 MED ORDER — LACTATED RINGERS IV SOLN
INTRAVENOUS | Status: DC | PRN
  Administered 2015-12-25 (×3): via INTRAVENOUS

## 2015-12-25 MED ORDER — PROMETHAZINE HCL 25 MG/ML IJ SOLN: 6.2500 mg | INTRAMUSCULAR | Status: DC | PRN

## 2015-12-25 MED ORDER — ACETAMINOPHEN 325 MG PO TABS
650.0000 mg | ORAL_TABLET | ORAL | Status: DC | PRN
  Administered 2015-12-26: 650 mg via ORAL
  Filled 2015-12-25: qty 2

## 2015-12-25 SURGICAL SUPPLY — 67 items
ADH SKN CLS APL DERMABOND .7 (GAUZE/BANDAGES/DRESSINGS) ×4
BLADE CLIPPER SURG (BLADE) ×6 IMPLANT
CLIP NEUROVISION LG (CLIP) ×2 IMPLANT
CONT SPEC 4OZ CLIKSEAL STRL BL (MISCELLANEOUS) ×3 IMPLANT
COROENT X/W 10X22X60 (Trauma) ×2 IMPLANT
COROENT X/W 12X22X60 (Cage) ×2 IMPLANT
COUNTER NEEDLE 20 DBL MAG RED (NEEDLE) ×2 IMPLANT
COVER BACK TABLE 60X90IN (DRAPES) ×3 IMPLANT
DERMABOND ADVANCED (GAUZE/BANDAGES/DRESSINGS) ×2
DERMABOND ADVANCED .7 DNX12 (GAUZE/BANDAGES/DRESSINGS) ×4 IMPLANT
DRAPE C-ARM 42X72 X-RAY (DRAPES) ×5 IMPLANT
DRAPE C-ARMOR (DRAPES) ×5 IMPLANT
DRAPE LAPAROTOMY 100X72X124 (DRAPES) ×5 IMPLANT
DRAPE POUCH INSTRU U-SHP 10X18 (DRAPES) ×5 IMPLANT
DRAPE SURG 17X23 STRL (DRAPES) ×3 IMPLANT
DRSG OPSITE POSTOP 4X6 (GAUZE/BANDAGES/DRESSINGS) ×2 IMPLANT
DRSG OPSITE POSTOP 4X8 (GAUZE/BANDAGES/DRESSINGS) ×2 IMPLANT
DURAPREP 26ML APPLICATOR (WOUND CARE) ×5 IMPLANT
ELECT REM PT RETURN 9FT ADLT (ELECTROSURGICAL) ×6
ELECTRODE REM PT RTRN 9FT ADLT (ELECTROSURGICAL) ×4 IMPLANT
GAUZE SPONGE 4X4 12PLY STRL (GAUZE/BANDAGES/DRESSINGS) ×1 IMPLANT
GAUZE SPONGE 4X4 16PLY XRAY LF (GAUZE/BANDAGES/DRESSINGS) IMPLANT
GLOVE BIO SURGEON STRL SZ8 (GLOVE) ×9 IMPLANT
GLOVE BIOGEL PI IND STRL 8 (GLOVE) ×6 IMPLANT
GLOVE BIOGEL PI IND STRL 8.5 (GLOVE) ×5 IMPLANT
GLOVE BIOGEL PI INDICATOR 8 (GLOVE) ×3
GLOVE BIOGEL PI INDICATOR 8.5 (GLOVE) ×3
GLOVE ECLIPSE 7.5 STRL STRAW (GLOVE) ×5 IMPLANT
GLOVE ECLIPSE 8.0 STRL XLNG CF (GLOVE) ×9 IMPLANT
GOWN STRL REUS W/ TWL LRG LVL3 (GOWN DISPOSABLE) ×1 IMPLANT
GOWN STRL REUS W/ TWL XL LVL3 (GOWN DISPOSABLE) ×4 IMPLANT
GOWN STRL REUS W/TWL 2XL LVL3 (GOWN DISPOSABLE) ×9 IMPLANT
GOWN STRL REUS W/TWL LRG LVL3 (GOWN DISPOSABLE) ×3
GOWN STRL REUS W/TWL XL LVL3 (GOWN DISPOSABLE) ×9
GUIDEWIRE NITINOL BEVEL TIP (WIRE) ×18 IMPLANT
KIT BASIN OR (CUSTOM PROCEDURE TRAY) ×3 IMPLANT
KIT DILATOR XLIF 5 (KITS) ×2 IMPLANT
KIT INFUSE X SMALL 1.4CC (Orthopedic Implant) ×2 IMPLANT
KIT POSITION SURG JACKSON T1 (MISCELLANEOUS) ×3 IMPLANT
KIT ROOM TURNOVER OR (KITS) ×3 IMPLANT
KIT SURGICAL ACCESS MAXCESS 4 (KITS) ×2 IMPLANT
KIT XLIF (KITS) ×1
MARKER SKIN DUAL TIP RULER LAB (MISCELLANEOUS) ×3 IMPLANT
MODULE NVM5 NEXT GEN EMG (NEEDLE) ×2 IMPLANT
NEEDLE HYPO 25X1 1.5 SAFETY (NEEDLE) ×6 IMPLANT
NEEDLE I PASS (NEEDLE) ×3 IMPLANT
NS IRRIG 1000ML POUR BTL (IV SOLUTION) ×3 IMPLANT
PACK LAMINECTOMY NEURO (CUSTOM PROCEDURE TRAY) ×5 IMPLANT
PAD ARMBOARD 7.5X6 YLW CONV (MISCELLANEOUS) ×11 IMPLANT
PATTIES SURGICAL .5 X.5 (GAUZE/BANDAGES/DRESSINGS) IMPLANT
PATTIES SURGICAL .5 X1 (DISPOSABLE) IMPLANT
PATTIES SURGICAL 1X1 (DISPOSABLE) IMPLANT
PUTTY BONE ATTRAX 10CC STRIP (Putty) ×2 IMPLANT
ROD RELINE MAS LORD 5.5X85MM (Rod) ×2 IMPLANT
ROD RELINE MAS LORD 5.5X90MM (Rod) ×2 IMPLANT
SCREW LOCK RELINE 5.5 TULIP (Screw) ×21 IMPLANT
SCREW MAS RELINE 6.5X45 POLY (Screw) ×18 IMPLANT
SPONGE LAP 4X18 X RAY DECT (DISPOSABLE) IMPLANT
STAPLER SKIN PROX WIDE 3.9 (STAPLE) ×3 IMPLANT
SUT VIC AB 1 CT1 18XBRD ANBCTR (SUTURE) ×3 IMPLANT
SUT VIC AB 1 CT1 8-18 (SUTURE) ×3
SUT VIC AB 2-0 CT1 18 (SUTURE) ×4 IMPLANT
SUT VIC AB 3-0 SH 8-18 (SUTURE) ×4 IMPLANT
TOWEL OR 17X24 6PK STRL BLUE (TOWEL DISPOSABLE) ×3 IMPLANT
TOWEL OR 17X26 10 PK STRL BLUE (TOWEL DISPOSABLE) ×3 IMPLANT
TRAY FOLEY W/METER SILVER 16FR (SET/KITS/TRAYS/PACK) ×3 IMPLANT
WATER STERILE IRR 1000ML POUR (IV SOLUTION) ×3 IMPLANT

## 2015-12-25 NOTE — Transfer of Care (Signed)
Immediate Anesthesia Transfer of Care Note  Patient: [name]  Procedure(s) Performed: Procedure(s) with comments: Right Lumbar three- four,  Lumbar four - five Anteriolateral lumbar interbody fusion with Percutaneous pedicle screws (Right) - Right L3-4 L4-5 Anteriolateral lumbar interbody fusion with Percutaneous pedicle screws LUMBAR PERCUTANEOUS PEDICLE SCREW 2 LEVEL (N/A)  Patient Location: PACU  Anesthesia Type:General  Level of Consciousness: awake, sedated, patient cooperative and responds to stimulation  Airway & Oxygen Therapy: Patient Spontanous Breathing and Patient connected to nasal cannula oxygen  Post-op Assessment: Report given to RN, Post -op Vital signs reviewed and stable and Patient moving all extremities  Post vital signs: Reviewed and stable  Last Vitals:  Vitals:   12/25/15 0819  BP: 131/74  Pulse: (!) 58  Resp: 20  Temp: 36.7 C    Last Pain:  Vitals:   12/25/15 0819  TempSrc: Oral         Complications: No apparent anesthesia complications

## 2015-12-25 NOTE — Anesthesia Procedure Notes (Signed)
Procedure Name: Intubation Date/Time: 12/25/2015 11:54 AM Performed by: Neldon Newport Pre-anesthesia Checklist: Timeout performed, Patient being monitored, Suction available, Patient identified and Emergency Drugs available Patient Re-evaluated:Patient Re-evaluated prior to inductionOxygen Delivery Method: Circle system utilized Preoxygenation: Pre-oxygenation with 100% oxygen Intubation Type: IV induction and Rapid sequence Ventilation: Mask ventilation without difficulty Laryngoscope Size: Mac and 4 Grade View: Grade II Tube type: Oral Tube size: 7.5 mm Number of attempts: 2 Placement Confirmation: breath sounds checked- equal and bilateral,  positive ETCO2 and ETT inserted through vocal cords under direct vision Secured at: 24 cm Tube secured with: Tape Dental Injury: Teeth and Oropharynx as per pre-operative assessment

## 2015-12-25 NOTE — Anesthesia Preprocedure Evaluation (Signed)
Anesthesia Evaluation  Patient identified by MRN, date of birth, ID band Patient awake    Reviewed: Allergy & Precautions, NPO status , Patient's Chart, lab work & pertinent test results  Airway Mallampati: II  TM Distance: >3 FB Neck ROM: Full    Dental  (+) Teeth Intact, Dental Advisory Given   Pulmonary shortness of breath, asthma , sleep apnea , former smoker,    breath sounds clear to auscultation       Cardiovascular hypertension, Pt. on medications  Rhythm:Regular Rate:Normal  ECHO 2015 EF 70%, EKG 10/2014 ST changes   Neuro/Psych    GI/Hepatic hiatal hernia, PUD, GERD  Medicated,  Endo/Other    Renal/GU STONES     Musculoskeletal   Abdominal (+)  Abdomen: soft.    Peds  Hematology   Anesthesia Other Findings   Reproductive/Obstetrics                             Lab Results  Component Value Date   WBC 7.4 12/18/2015   HGB 13.0 12/18/2015   HCT 40.8 12/18/2015   MCV 88.3 12/18/2015   PLT 236 12/18/2015   Lab Results  Component Value Date   CREATININE 0.77 12/18/2015   BUN 16 12/18/2015   NA 138 12/18/2015   K 3.4 (L) 12/18/2015   CL 105 12/18/2015   CO2 25 12/18/2015    Anesthesia Physical  Anesthesia Plan  ASA: II  Anesthesia Plan: General   Post-op Pain Management:    Induction: Intravenous  Airway Management Planned: Oral ETT  Additional Equipment:   Intra-op Plan:   Post-operative Plan:   Informed Consent: I have reviewed the patients History and Physical, chart, labs and discussed the procedure including the risks, benefits and alternatives for the proposed anesthesia with the patient or authorized representative who has indicated his/her understanding and acceptance.   Dental advisory given  Plan Discussed with:   Anesthesia Plan Comments: (GLIDE available, multimodal pain RX)        Anesthesia Quick Evaluation

## 2015-12-25 NOTE — Interval H&P Note (Signed)
History and Physical Interval Note:  12/25/2015 11:25 AM  [name]  has presented today for surgery, with the diagnosis of Spinal stenosis, Lumbar region  The various methods of treatment have been discussed with the patient and family. After consideration of risks, benefits and other options for treatment, the patient has consented to  Procedure(s) with comments: Right L3-4 L4-5 Anteriolateral lumbar interbody fusion with Percutaneous pedicle screws (Right) - Right L3-4 L4-5 Anteriolateral lumbar interbody fusion with Percutaneous pedicle screws LUMBAR PERCUTANEOUS PEDICLE SCREW 2 LEVEL (N/A) as a surgical intervention .  The patient's history has been reviewed, patient examined, no change in status, stable for surgery.  I have reviewed the patient's chart and labs.  Questions were answered to the patient's satisfaction.     Porsche Noguchi D

## 2015-12-25 NOTE — Op Note (Signed)
12/25/2015  3:37 PM  PATIENT:  [name]  65 y.o. male  PRE-OPERATIVE DIAGNOSIS:  Spinal stenosis, Lumbar region L 34 and L 45 levels with herniated lumbar discs, spondylosis, DDD, radiculopathy  POST-OPERATIVE DIAGNOSIS:  Spinal stenosis, Lumbar region L 34 and L 45 levels with herniated lumbar discs, spondylosis, DDD, radiculopathy  PROCEDURE:  Procedure(s) with comments: Right Lumbar three- four,  Lumbar four - five Anteriolateral lumbar interbody fusion with Percutaneous pedicle screws (Right) - Right L3-4 L4-5 Anteriolateral lumbar interbody fusion with Percutaneous pedicle screws LUMBAR PERCUTANEOUS PEDICLE SCREW 2 LEVEL (N/A)  SURGEON:  Surgeon(s) and Role:    * Erline Levine, MD - Primary    * Kevan Ny Ditty, MD - Assisting  PHYSICIAN ASSISTANT:   ASSISTANTS: Poteat, RN   ANESTHESIA:   general  EBL:  Total I/O In: 2000 [I.V.:2000] Out: 600 [Urine:450; Blood:150]  BLOOD ADMINISTERED:none  DRAINS: none   LOCAL MEDICATIONS USED:  MARCAINE    and LIDOCAINE   SPECIMEN:  No Specimen  DISPOSITION OF SPECIMEN:  N/A  COUNTS:  YES  TOURNIQUET:  * No tourniquets in log *  DICTATION: DICTATION: Patient is a 65 year old with severe spondylosis stenosis and herniated disc of the lumbar spine. It was elected to take him to surgery for anterolateral decompression and posterior pedicle screw fixation.  Procedure: Patient was brought to the operating room and placed in a left lateral decubitus position on the operative table and using orthogonally projected C-arm fluoroscopy the patient was placed so that the L3-4 and L4-5 levels were visualized in AP and lateral plane. The patient was then taped into position. The table was flexed so as to expose the L4-5 level as the patient has a high iliac crest. Skin was marked along with a posterior finger dissection incision. His flank was then prepped and draped in usual sterile fashion and incisions were made sequentially at L4-5  L3-4 levels. Posterior finger dissection was made to enter the retroperitoneal space and then subsequently the probe was inserted into the psoas muscle from the left side initially at the L4-5 level. After mapping the neural elements were able to dock the probe per the midpoint of this vertebral level and without indications electrically of too close proximity to the neural tissues. Subsequently the self-retaining tractor was.after sequential dilators were utilized the shim was employed and the interspace was cleared of psoas muscle and then incised. A thorough discectomy was performed. Angled instruments were used to clear the interspace of disc material. After thorough discectomy was performed and this was performed using AP and lateral fluoroscopy a 10 lordotic by 60 x 22 mm implant was packed with extra small BMP and Attrax bone graft extender. This was tamped into position using the slides and its position was confirmed on AP and lateral fluoroscopy. Subsequently exposure was performed at the L3-4 level and similar dissection was performed with locking of the self-retaining retractor. At this level were able to place a 12 lordotic by 22 x 60 mm implant packed in a similar fashion.  He mostasis was assured the wounds were irrigated interrupted Vicryl sutures. Occlusive dressings were placed.  Patient was then turned into a prone position on the [place] table and using AP and lateral fluoroscopy throughout this portion of the procedure, pedicle screws were placed using Nuvasive Reline cannulated percutaneous screws. 2 screws were placed at L3 and two at L4 of a similar size and 2 at L5 of similar size. All screws were 6.5 x 45 mm  in size. 85 mm rod on the right and 90 mm on the left were  then affixed to the screw heads through a separate stab incision and locked down on the screws on the . All connections were then torqued and the Towers were disassembled. The wounds were irrigated and then closed with 1, 2-0  and 3-0 Vicryl stitches. Sterile occlusive dressing was placed with Dermabond and occlusive dressing. The patient was then extubated in the operating room and taken to recovery in stable and satisfactory condition having tolerated his operation well. Counts were correct at the end of the case.   PLAN OF CARE: Admit to inpatient   PATIENT DISPOSITION:  PACU - hemodynamically stable.   Delay start of Pharmacological VTE agent (>24hrs) due to surgical blood loss or risk of bleeding: yes

## 2015-12-25 NOTE — Progress Notes (Signed)
Orthopedic Tech Progress Note Patient Details:  NAME [name] [birth year] [id number] Patient already has brace. Patient ID: [name], male   DOB: Jan 14, 1951, 65 y.o.   MRN: [id number]   Braulio Bosch 12/25/2015, 8:43 PM

## 2015-12-25 NOTE — H&P (Signed)
Patient ID:   [id number] Patient: [name]  Date of Birth: 06/16/1950 Visit Type: Office Visit   Date: 11/21/2015 02:00 PM Provider: Marchia Meiers. Vertell Limber MD   This 65 year old male presents for back pain.  History of Present Illness: 1.  back pain    [name] returns, noting no relief from the lumbar injection in June or the hip injection in July. He continues to have low back and left leg pain down to his ankle. He reports his left leg pain is worse than his back pain.   X-rays reveal disc height loss and degenerative changes at L3-4 and L4-5       PAST MEDICAL HISTORY, SURGICAL HISTORY, FAMILY HISTORY, SOCIAL HISTORY AND REVIEW OF SYSTEMS  06/13/2015, which I have signed.   MEDICATIONS(added, continued or stopped this visit): Started Medication Directions Instruction Stopped   Align 4 mg capsule take 1 tablet by mouth daily     fexofenadine 180 mg tablet take 1 tablet by oral route  every day    11/17/2012 Neurontin 400 mg capsule take 2 capsule by oral route 2 times every day  and 1 capsule PO midday ( 5 per day)    08/15/2015 oxycodone 10 mg tablet take 1 tablet po q4-6hrs prn pain     Protonix 40 mg tablet,delayed release take 1 tablet by oral route  every day    08/16/2015 TIZANIDINE HCL 2 MG TABLET TAKE 1 OR 2 TABLETS BY MOUTH EVERY 8 HOURS AS NEEDED FOR SPASM     trazodone 150 mg tablet take 1 tablet by oral route  every day at bedtime     Vitamin D3 1,000 unit tablet take 1 tablet by mouth daily       ALLERGIES:    Vitals Date Temp F BP Pulse Ht In Wt Lb BMI BSA Pain Score  11/21/2015  128/83 108 71 242 33.75  6/10      IMPRESSION The patient got no relief from the hip injection, so we will go ahead with surgery. We had originally planned to do just an L4-5 fusion, but on further review of his imaging, he has a disc herniation with L 4 nerve root compression and significant degeneration at the L3-4 level as well. Therefore, I recommend an L3-4, L4-5  XLIF with percutaneous pedicle screw fixation for alleviation of his low back and left leg pain.   Completed Orders (this encounter) Order Details Reason Side Interpretation Result Initial Treatment Date Region  Lumbar Spine- AP/Lat/Flex/Ex      11/21/2015   Scoliosis- AP/Lat      11/21/2015    Assessment/Plan # Detail Type Description   1. Assessment Spinal stenosis, lumbar region (M48.06).   Plan Orders Aspen Lo Sag Rigid Panel Quick.       2. Assessment Lumbar radiculopathy (M54.16).         Pain Assessment/Treatment Pain Scale: 6/10. Method: Numeric Pain Intensity Scale. Location: back. Onset: 01/11/1985. Duration: varies. Quality: discomforting. Pain Assessment/Treatment follow-up plan of care: Patient taking medications as prescribed..  Schedule L3-4, L4-5 XLIF. Nurse education given. Fitted for LSO brace.   Orders: Diagnostic Procedures: Assessment Procedure  M48.06 Lumbar Spine- AP/Lat/Flex/Ex  M48.06 Scoliosis- AP/Lat  M54.16 Lumbar Spine- AP/Lat  Miscellaneous: Assessment   M48.06 Aspen Lo Sag Rigid Panel Quick             Provider:  Marchia Meiers. Vertell Limber MD  11/21/2015 03:30 PM Dictation edited by: Johnella Moloney    CC Providers: Herbie [place]  [place] [address],  [place]  [id number]-   [facility] [address], [place] [address]-              Electronically signed by Marchia Meiers. Vertell Limber MD on 11/21/2015 04:42 PM

## 2015-12-25 NOTE — Brief Op Note (Signed)
12/25/2015  3:37 PM  PATIENT:  [name]  65 y.o. male  PRE-OPERATIVE DIAGNOSIS:  Spinal stenosis, Lumbar region L 34 and L 45 levels with herniated lumbar discs, spondylosis, DDD, radiculopathy  POST-OPERATIVE DIAGNOSIS:  Spinal stenosis, Lumbar region L 34 and L 45 levels with herniated lumbar discs, spondylosis, DDD, radiculopathy  PROCEDURE:  Procedure(s) with comments: Right Lumbar three- four,  Lumbar four - five Anteriolateral lumbar interbody fusion with Percutaneous pedicle screws (Right) - Right L3-4 L4-5 Anteriolateral lumbar interbody fusion with Percutaneous pedicle screws LUMBAR PERCUTANEOUS PEDICLE SCREW 2 LEVEL (N/A)  SURGEON:  Surgeon(s) and Role:    * Erline Levine, MD - Primary    * Kevan Ny Ditty, MD - Assisting  PHYSICIAN ASSISTANT:   ASSISTANTS: Poteat, RN   ANESTHESIA:   general  EBL:  Total I/O In: 2000 [I.V.:2000] Out: 600 [Urine:450; Blood:150]  BLOOD ADMINISTERED:none  DRAINS: none   LOCAL MEDICATIONS USED:  MARCAINE    and LIDOCAINE   SPECIMEN:  No Specimen  DISPOSITION OF SPECIMEN:  N/A  COUNTS:  YES  TOURNIQUET:  * No tourniquets in log *  DICTATION: DICTATION: Patient is a 65 year old with severe spondylosis stenosis and herniated disc of the lumbar spine. It was elected to take him to surgery for anterolateral decompression and posterior pedicle screw fixation.  Procedure: Patient was brought to the operating room and placed in a left lateral decubitus position on the operative table and using orthogonally projected C-arm fluoroscopy the patient was placed so that the L3-4 and L4-5 levels were visualized in AP and lateral plane. The patient was then taped into position. The table was flexed so as to expose the L4-5 level as the patient has a high iliac crest. Skin was marked along with a posterior finger dissection incision. His flank was then prepped and draped in usual sterile fashion and incisions were made sequentially at L4-5  L3-4 levels. Posterior finger dissection was made to enter the retroperitoneal space and then subsequently the probe was inserted into the psoas muscle from the left side initially at the L4-5 level. After mapping the neural elements were able to dock the probe per the midpoint of this vertebral level and without indications electrically of too close proximity to the neural tissues. Subsequently the self-retaining tractor was.after sequential dilators were utilized the shim was employed and the interspace was cleared of psoas muscle and then incised. A thorough discectomy was performed. Angled instruments were used to clear the interspace of disc material. After thorough discectomy was performed and this was performed using AP and lateral fluoroscopy a 10 lordotic by 60 x 22 mm implant was packed with extra small BMP and Attrax bone graft extender. This was tamped into position using the slides and its position was confirmed on AP and lateral fluoroscopy. Subsequently exposure was performed at the L3-4 level and similar dissection was performed with locking of the self-retaining retractor. At this level were able to place a 12 lordotic by 22 x 60 mm implant packed in a similar fashion.  He mostasis was assured the wounds were irrigated interrupted Vicryl sutures. Occlusive dressings were placed.  Patient was then turned into a prone position on the [place] table and using AP and lateral fluoroscopy throughout this portion of the procedure, pedicle screws were placed using Nuvasive Reline cannulated percutaneous screws. 2 screws were placed at L3 and two at L4 of a similar size and 2 at L5 of similar size. All screws were 6.5 x 45 mm  in size. 85 mm rod on the right and 90 mm on the left were  then affixed to the screw heads through a separate stab incision and locked down on the screws on the . All connections were then torqued and the Towers were disassembled. The wounds were irrigated and then closed with 1, 2-0  and 3-0 Vicryl stitches. Sterile occlusive dressing was placed with Dermabond and occlusive dressing. The patient was then extubated in the operating room and taken to recovery in stable and satisfactory condition having tolerated his operation well. Counts were correct at the end of the case.   PLAN OF CARE: Admit to inpatient   PATIENT DISPOSITION:  PACU - hemodynamically stable.   Delay start of Pharmacological VTE agent (>24hrs) due to surgical blood loss or risk of bleeding: yes

## 2015-12-26 ENCOUNTER — Encounter (HOSPITAL_COMMUNITY): Payer: Self-pay | Admitting: Neurosurgery

## 2015-12-26 MED ORDER — DIAZEPAM 5 MG PO TABS
5.0000 mg | ORAL_TABLET | Freq: Once | ORAL | Status: AC
  Administered 2015-12-26: 5 mg via ORAL
  Filled 2015-12-26: qty 1

## 2015-12-26 MED ORDER — HYDROMORPHONE HCL 2 MG PO TABS
2.0000 mg | ORAL_TABLET | ORAL | Status: DC | PRN
  Administered 2015-12-26 – 2015-12-27 (×4): 4 mg via ORAL
  Filled 2015-12-26 (×4): qty 2

## 2015-12-26 MED FILL — Heparin Sodium (Porcine) Inj 1000 Unit/ML: INTRAMUSCULAR | Qty: 30 | Status: AC

## 2015-12-26 MED FILL — Sodium Chloride IV Soln 0.9%: INTRAVENOUS | Qty: 3000 | Status: AC

## 2015-12-26 NOTE — Evaluation (Signed)
Occupational Therapy Evaluation Patient Details Name: [name] MRN: [id number] DOB: January 03, 1951 Today's Date: 12/26/2015    History of Present Illness 65 yo male admitted with PLIF L3-4 L4-5 PMH:  Past Medical History:  Diagnosis Date  . Abdominal pain   . Allergic rhinitis, cause unspecified   . Asthma   . Benign essential hypertension    Low salt diet. Systolic goal [id number], diastolic goal 0000000.  Marland Kitchen Blood in stool   . Cancer ([place])    basal cell removed several places   . Carpal tunnel syndrome, bilateral   . Cervicalgia   . Chest discomfort    Could be related to hiatal hernia but because of Hx of HTN, HLD and + FHX, an exercise stress test & referral to cardiology will be arranged.  . Chronic back pain    On celebrex  . Chronic insomnia   . Chronic neck pain    On celebrex  . Complication of anesthesia    woke up slowly after deviated septum surg. Frances Maywood.   . Degenerative disk disease    degenerative in cervical area & lumbar area  . Diarrhea   . Diverticulitis   . Diverticulosis   . Dizziness    occational  . DOE (dyspnea on exertion)    occasional. Now can also feel when he is at rest. Former smoker.  Marland Kitchen Dyspepsia   . Elevated blood pressure 03/02/13   (x) 2 years. Referral to cardiology  . 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 [name]  . Esophageal reflux   . 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 Gastric ulcer   . Hiatal hernia   . History of kidney stones   . HOH (hard of hearing)   . Hyperlipidemia    Last FLP in 2012 slightly elevated. LDL goal <100.  . Midsternal chest pain    Has had for a while, which he contributed to hiatal hernia and peptic disease. No radiation, not sure about duration, not associated with diaphoresis.  . Pain in joint, site unspecified   . Peptic disease   . Sleep apnea syndrome    was to try cpap-did not get one-repeat  studies could not sleep  . Sleep disturbance, unspecified   . SOB (shortness of breath)   . Tremor    Per Dr. [name]  . Ventral hernia    never corrected       Clinical Impression   Patient is s/p PLIF L3-5 surgery resulting in functional limitations due to the deficits listed below (see OT problem list). PTA was independent. Patient will benefit from skilled OT acutely to increase independence and safety with ADLS to allow discharge home without follow up .     Follow Up Recommendations  No OT follow up    Equipment Recommendations  3 in 1 bedside comode;Other (comment) (RW)    Recommendations for Other Services       Precautions / Restrictions Precautions Precautions: Back Precaution Comments: back handout provided and reviewed in detail Required Braces or Orthoses: Spinal Brace Spinal Brace: Lumbar corset;Applied in sitting position      Mobility Bed Mobility Overal bed mobility: Modified Independent                Transfers Overall transfer level: Needs assistance Equipment used: Rolling walker (2 wheeled) Transfers: Sit to/from Stand Sit to Stand: Min assist  General transfer comment: educated on hand placement    Balance                                            ADL Overall ADL's : Needs assistance/impaired Eating/Feeding: Modified independent   Grooming: Wash/dry hands;Min guard;Standing   Upper Body Bathing: Min guard   Lower Body Bathing: Maximal assistance Lower Body Bathing Details (indicate cue type and reason): unable to cross         Toilet Transfer: Minimal assistance           Functional mobility during ADLs: Min guard General ADL Comments: educated on back precautions with adls     Vision     Perception     Praxis      Pertinent Vitals/Pain Pain Assessment: No/denies pain     Hand Dominance Right   Extremity/Trunk Assessment Upper Extremity Assessment Upper Extremity Assessment:  Overall WFL for tasks assessed   Lower Extremity Assessment Lower Extremity Assessment: Overall WFL for tasks assessed   Cervical / Trunk Assessment Cervical / Trunk Assessment: Other exceptions Cervical / Trunk Exceptions: s/p surg    Communication Communication Communication: No difficulties   Cognition Arousal/Alertness: Awake/alert Behavior During Therapy: WFL for tasks assessed/performed Overall Cognitive Status: Within Functional Limits for tasks assessed                     General Comments       Exercises       Shoulder Instructions      Home Living Family/patient expects to be discharged to:: Private residence Living Arrangements: Spouse/significant other Available Help at Discharge: Family Type of Home: House Home Access: Stairs to enter Technical brewer of Steps: 3 Entrance Stairs-Rails: None Home Layout: Two level;Bed/bath upstairs;Able to live on main level with bedroom/bathroom;Full bath on main level     Bathroom Shower/Tub: Occupational psychologist: Standard     Home Equipment: Cane - single point;Walker - 2 wheels;Shower seat          Prior Functioning/Environment Level of Independence: Independent                 OT Problem List: Decreased strength;Decreased activity tolerance;Impaired balance (sitting and/or standing);Decreased safety awareness;Decreased knowledge of use of DME or AE;Decreased knowledge of precautions   OT Treatment/Interventions:      OT Goals(Current goals can be found in the care plan section) Acute Rehab OT Goals Patient Stated Goal: to return home OT Goal Formulation: With patient Time For Goal Achievement: 01/09/16 Potential to Achieve Goals: Good  OT Frequency: Min 2X/week   Barriers to D/C:            Co-evaluation              End of Session Equipment Utilized During Treatment: Gait belt;Rolling walker;Back brace Nurse Communication: Mobility  status;Precautions  Activity Tolerance: Patient tolerated treatment well Patient left: in chair;with call bell/phone within reach   Time: [id number] OT Time Calculation (min): 19 min Charges:  OT General Charges $OT Visit: 1 Procedure OT Evaluation $OT Eval Moderate Complexity: 1 Procedure G-Codes:    Parke Poisson B 01/16/2016, 8:57 AM  Jeri Modena   OTR/L [phone] Office: [phone] .

## 2015-12-26 NOTE — Evaluation (Signed)
Physical Therapy Evaluation Patient Details Name: [name] MRN: [id number] DOB: 22-Nov-1950 Today's Date: 12/26/2015   History of Present Illness  65 yo male admitted with PLIF L3-4 L4-5 PMH:HOH, SOB, HTN, DDD, chronic neck pain (s/p ACDF 11/2014), and bil carpal tunnel release.    Clinical Impression  Pt is able to mobilize with min to min guard assist overall, is reliant on RW during gait.  He is mobilizing well enough to d/c home when MD deems appropriate with wife's supervision at discharge and a RW for support during gait.   PT to follow acutely for deficits listed below, to reinforce education and safe mobility.     Follow Up Recommendations No PT follow up;Supervision for mobility/OOB    Equipment Recommendations  Rolling walker with 5" wheels    Recommendations for Other Services    NA    Precautions / Restrictions Precautions Precautions: Back Precaution Booklet Issued: Yes (comment) Precaution Comments: precautions reviewed Required Braces or Orthoses: Spinal Brace Spinal Brace: Lumbar corset;Applied in sitting position      Mobility  Bed Mobility Overal bed mobility: Modified Independent             General bed mobility comments: Pt OOB in chair  Transfers Overall transfer level: Needs assistance Equipment used: Rolling walker (2 wheeled) Transfers: Sit to/from Stand Sit to Stand: Min guard         General transfer comment: Min guard assist for safety due to slow, guarded transitions, heavy reliance on hands for control and pt coming up over flexed knees.   Ambulation/Gait Ambulation/Gait assistance: Min guard Ambulation Distance (Feet): 200 Feet Assistive device: Rolling walker (2 wheeled) Gait Pattern/deviations: Step-through pattern;Shuffle (flexed knees) Gait velocity: decreased Gait velocity interpretation: Below normal speed for age/gender General Gait Details: Pt with flexed knee gait pattern, decreased foot clearance bil (without foot  drop, just shuffling), improved with increased gait distance.   Stairs Stairs: Yes Stairs assistance: Min assist Stair Management: One rail Right;Step to pattern;Forwards;Backwards;With walker Number of Stairs: 2 (x2, once forward with rail, once backwards with RW) General stair comments: Min assist to stabilize during transitions when going forward wtih rail, min assist to stabilize and help lift RW for backwards with RW.  Verbal cues for safe LE sequencing up with stronger leg down with weaker leg.          Balance Overall balance assessment: Needs assistance Sitting-balance support: Feet supported;Bilateral upper extremity supported Sitting balance-Leahy Scale: Good     Standing balance support: Bilateral upper extremity supported Standing balance-Leahy Scale: Poor                               Pertinent Vitals/Pain Pain Assessment: 0-10 Pain Score: 8  Pain Location: incisional Pain Descriptors / Indicators: Aching;Burning Pain Intervention(s): Limited activity within patient's tolerance;Monitored during session;Repositioned    Home Living Family/patient expects to be discharged to:: Private residence Living Arrangements: Spouse/significant other Available Help at Discharge: Family Type of Home: House Home Access: Stairs to enter Entrance Stairs-Rails: None Entrance Stairs-Number of Steps: 3 Home Layout: Two level;Bed/bath upstairs;Able to live on main level with bedroom/bathroom;Full bath on main level Home Equipment: Cane - single point;Walker - 2 wheels;Shower seat      Prior Function Level of Independence: Independent               Hand Dominance   Dominant Hand: Right    Extremity/Trunk Assessment   Upper Extremity  Assessment: Defer to OT evaluation           Lower Extremity Assessment: Generalized weakness (flexed knees, decreased foot clearance bil, reports soreness)      Cervical / Trunk Assessment: Other exceptions   Communication   Communication: No difficulties  Cognition Arousal/Alertness: Awake/alert Behavior During Therapy: WFL for tasks assessed/performed Overall Cognitive Status: Within Functional Limits for tasks assessed                      General Comments General comments (skin integrity, edema, etc.): Spoke with pt re: activity progression, don't sit for long periods of time, and sleeping positions.         Assessment/Plan    PT Assessment Patient needs continued PT services  PT Problem List Decreased strength;Decreased activity tolerance;Decreased balance;Decreased mobility;Decreased knowledge of use of DME;Decreased knowledge of precautions;Pain          PT Treatment Interventions Gait training;DME instruction;Stair training;Functional mobility training;Therapeutic activities;Therapeutic exercise;Balance training;Patient/family education    PT Goals (Current goals can be found in the Care Plan section)  Acute Rehab PT Goals Patient Stated Goal: to go home when he is ready PT Goal Formulation: With patient Time For Goal Achievement: 01/02/16 Potential to Achieve Goals: Good    Frequency Min 5X/week           End of Session Equipment Utilized During Treatment: Back brace Activity Tolerance: Patient limited by pain;Patient limited by fatigue Patient left: in chair;with call bell/phone within reach Nurse Communication: Patient requests pain meds         Time: 0820-0845 PT Time Calculation (min) (ACUTE ONLY): 25 min   Charges:   PT Evaluation $PT Eval Moderate Complexity: 1 Procedure PT Treatments $Gait Training: 8-22 mins        Luda Charbonneau B. Marthann Abshier, PT, DPT [phone]   12/26/2015, 10:54 AM

## 2015-12-26 NOTE — Progress Notes (Addendum)
Subjective: Patient reports "I vomited one time last night, but I'm not nauseated this morning"  Objective: Vital signs in last 24 hours: Temp:  [97.5 F (36.4 C)-99.5 F (37.5 C)] 99.5 F (37.5 C) (10/04 0358) Pulse Rate:  [58-102] 85 (10/04 0358) Resp:  [16-28] 18 (10/04 0358) BP: (96-162)/(52-95) 97/64 (10/04 0358) SpO2:  [89 %-100 %] 94 % (10/04 0358) Weight:  [109.8 kg (242 lb)] 109.8 kg (242 lb) (10/03 0819)  Intake/Output from previous day: 10/03 0701 - 10/04 0700 In: 2400 [I.V.:2400] Out: 2250 [Urine:2100; Blood:150] Intake/Output this shift: No intake/output data recorded.  Alert, conversant. Good strength BLE. Incisions without erythema, swelling, or drainage beneath honeycomb & Dermabond. Pt reports lumbar discomfort, but no leg pain.  Lab Results: No results for input(s): WBC, HGB, HCT, PLT in the last 72 hours. BMET No results for input(s): NA, K, CL, CO2, GLUCOSE, BUN, CREATININE, CALCIUM in the last 72 hours.  Studies/Results: Dg Lumbar Spine 2-3 Views  Result Date: 12/25/2015 CLINICAL DATA:  Status post L3-4 and L4-5 XL IF with posterior pedicle screw and rod placement. Fluoro time reported is 4 minutes 19 seconds. Five fluoro spot images are reviewed. EXAM: DG C-ARM 61-120 MIN; LUMBAR SPINE - 2-3 VIEW COMPARISON:  Lumbar spine series of November 21, 2015 FINDINGS: The patient has undergone inter discal device placement at L3-4 and at L4-5. Pedicle screw and connecting rod fixation has been performed at these levels bilaterally. IMPRESSION: There is no immediate postprocedure complication following posterior pedicle and interbody fusion at L3-4 and L4-5. Electronically Signed   By: David  Martinique M.D.   On: 12/25/2015 16:49   Dg C-arm 61-120 Min  Result Date: 12/25/2015 CLINICAL DATA:  Status post L3-4 and L4-5 XL IF with posterior pedicle screw and rod placement. Fluoro time reported is 4 minutes 19 seconds. Five fluoro spot images are reviewed. EXAM: DG C-ARM 61-120  MIN; LUMBAR SPINE - 2-3 VIEW COMPARISON:  Lumbar spine series of November 21, 2015 FINDINGS: The patient has undergone inter discal device placement at L3-4 and at L4-5. Pedicle screw and connecting rod fixation has been performed at these levels bilaterally. IMPRESSION: There is no immediate postprocedure complication following posterior pedicle and interbody fusion at L3-4 and L4-5. Electronically Signed   By: David  Martinique M.D.   On: 12/25/2015 16:49    Assessment/Plan: Improving  LOS: 1 day  Mobilize with PT today   Verdis Prime 12/26/2015, 7:55 AM   Patient doing well.  Mobilize with PT.  Sore in back.

## 2015-12-26 NOTE — Anesthesia Postprocedure Evaluation (Signed)
Anesthesia Post Note  Patient: [name]  Procedure(s) Performed: Procedure(s) (LRB): Right Lumbar three- four,  Lumbar four - five Anteriolateral lumbar interbody fusion with Percutaneous pedicle screws (Right) LUMBAR PERCUTANEOUS PEDICLE SCREW 2 LEVEL (N/A)  Patient location during evaluation: PACU Anesthesia Type: General Level of consciousness: awake and alert Pain management: pain level controlled Vital Signs Assessment: post-procedure vital signs reviewed and stable Respiratory status: spontaneous breathing, nonlabored ventilation, respiratory function stable and patient connected to nasal cannula oxygen Cardiovascular status: blood pressure returned to baseline and stable Postop Assessment: no signs of nausea or vomiting Anesthetic complications: no    Last Vitals:  Vitals:   12/26/15 0358 12/26/15 0801  BP: 97/64 123/80  Pulse: 85 98  Resp: 18 18  Temp: 37.5 C     Last Pain:  Vitals:   12/26/15 1024  TempSrc:   PainSc: 6                  Tiajuana Amass

## 2015-12-27 MED ORDER — METHOCARBAMOL 500 MG PO TABS: 500.0000 mg | ORAL_TABLET | Freq: Four times a day (QID) | ORAL | 1 refills | Status: DC | PRN

## 2015-12-27 MED ORDER — HYDROMORPHONE HCL 2 MG PO TABS: 2.0000 mg | ORAL_TABLET | ORAL | 0 refills | Status: DC | PRN

## 2015-12-27 NOTE — Progress Notes (Signed)
Physical Therapy Treatment Patient Details Name: [name] MRN: [id number] DOB: Aug 02, 1950 Today's Date: 12/27/2015    History of Present Illness 65 yo male admitted with PLIF L3-4 L4-5 PMH:HOH, SOB, HTN, DDD, chronic neck pain (s/p ACDF 11/2014), and bil carpal tunnel release.      PT Comments    Pt wanting to practice stairs with rail due to wanting to go to his upstairs bedroom at time of d/c.  He performed 3 steps with 1 rail sideways with good technique.  Pt with 8/10 and reports fatigue and deferred further stairs to save energy due to pending d/c home. Recommend RW.  Follow Up Recommendations  No PT follow up;Supervision for mobility/OOB     Equipment Recommendations  Rolling walker with 5" wheels    Recommendations for Other Services       Precautions / Restrictions Precautions Precautions: Back Precaution Comments: precautions reviewed Required Braces or Orthoses: Spinal Brace Spinal Brace: Lumbar corset;Applied in sitting position Restrictions Weight Bearing Restrictions: No    Mobility  Bed Mobility               General bed mobility comments: Pt standing up with wife without brace.  Donned brace  Transfers Overall transfer level: Needs assistance Equipment used: Rolling walker (2 wheeled) Transfers: Sit to/from Stand Sit to Stand: Supervision         General transfer comment: stand > sit with S. moves slowly  Ambulation/Gait Ambulation/Gait assistance: Min guard;Supervision Ambulation Distance (Feet): 200 Feet Assistive device: Rolling walker (2 wheeled) Gait Pattern/deviations: Step-through pattern Gait velocity: decreased Gait velocity interpretation: Below normal speed for age/gender General Gait Details: Slow guarded gait with slightly flexed knees   Stairs Stairs: Yes Stairs assistance: Min guard Stair Management: One rail Right;Step to pattern;Sideways Number of Stairs: 3 General stair comments: Pt and wife wanted to practice  for steps up to bedroom.  They are not concerned about steps to enter.  DIscussed use of cane and HHA.  Pt with good sideways technique for 3 steps. Deferred more stairs as pt reports fatigue.  Wheelchair Mobility    Modified Rankin (Stroke Patients Only)       Balance     Sitting balance-Leahy Scale: Good     Standing balance support: During functional activity Standing balance-Leahy Scale: Fair                      Cognition Arousal/Alertness: Awake/alert Behavior During Therapy: WFL for tasks assessed/performed Overall Cognitive Status: Within Functional Limits for tasks assessed                      Exercises      General Comments General comments (skin integrity, edema, etc.): Able to stand without UE assist      Pertinent Vitals/Pain Pain Assessment: 0-10 Pain Score: 8  Pain Location: back Pain Descriptors / Indicators: Aching;Guarding Pain Intervention(s): Monitored during session;Limited activity within patient's tolerance    Home Living                      Prior Function            PT Goals (current goals can now be found in the care plan section) Acute Rehab PT Goals Patient Stated Goal: to go home when he is ready PT Goal Formulation: With patient Time For Goal Achievement: 01/02/16 Potential to Achieve Goals: Good Progress towards PT goals: Progressing toward goals    Frequency  Min 5X/week      PT Plan Current plan remains appropriate    Co-evaluation             End of Session Equipment Utilized During Treatment: Back brace Activity Tolerance: Patient limited by fatigue;Patient limited by pain Patient left: in bed;with family/visitor present     Time: 0943-1000 PT Time Calculation (min) (ACUTE ONLY): 17 min  Charges:  $Gait Training: 8-22 mins                    G Codes:      Taylan Mayhan LUBECK 12/27/2015, 10:40 AM

## 2015-12-27 NOTE — Discharge Instructions (Signed)

## 2015-12-27 NOTE — Progress Notes (Signed)
Occupational Therapy Treatment Patient Details Name: [name] MRN: [id number] DOB: 1950-10-29 Today's Date: 12/27/2015    History of present illness 64 yo male admitted with PLIF L3-4 L4-5 PMH:HOH, SOB, HTN, DDD, chronic neck pain (s/p ACDF 11/2014), and bil carpal tunnel release.     OT comments  Pt and wife educated on AE (grabber/reacher, long handle sponge, toileting tongs) and practiced. Pt has walk in shower and educated on sequence, using 3 in 1 as shower chair. Pt and wife with no further questions.  Follow Up Recommendations  No OT follow up;Supervision - Intermittent    Equipment Recommendations  3 in 1 bedside comode;Other (comment) (RW)    Recommendations for Other Services      Precautions / Restrictions Precautions Precautions: Back Precaution Comments: precautions reviewed Required Braces or Orthoses: Spinal Brace Spinal Brace: Lumbar corset;Applied in sitting position Restrictions Weight Bearing Restrictions: No       Mobility Bed Mobility               General bed mobility comments: Pt sitting EOB when OT entered the room  Transfers Overall transfer level: Needs assistance Equipment used: Rolling walker (2 wheeled) Transfers: Sit to/from Stand Sit to Stand: Supervision         General transfer comment: stand > sit with S. moves slowly    Balance Overall balance assessment: Needs assistance   Sitting balance-Leahy Scale: Good     Standing balance support: During functional activity Standing balance-Leahy Scale: Fair                     ADL Overall ADL's : Needs assistance/impaired Eating/Feeding: Modified independent           Lower Body Bathing: Min guard;With caregiver independent assisting;With adaptive equipment;Sit to/from stand Lower Body Bathing Details (indicate cue type and reason): Pt educated on using long handle sponge for LB bathing while maintaining precautions Upper Body Dressing : Moderate  assistance;With caregiver independent assisting;Sitting Upper Body Dressing Details (indicate cue type and reason): Pt and wife were donning shirt when OT enetered the room. Pt able to maintain precautions, and help direct in don of brace, OT educated in checking alignment Lower Body Dressing: Maximal assistance;With caregiver independent assisting;With adaptive equipment;Cueing for sequencing;Cueing for back precautions;Sit to/from stand Lower Body Dressing Details (indicate cue type and reason): Pt already dressed with wife's help (Pt report max A) Pt educated in use of grabber/reacher and Pt replied "Oh yeah, I have one of those".  Toilet Transfer: Magazine features editor Details (indicate cue type and reason): heavy use of arm rests on BSC over toilet. Per Pt his brother/friend will be bringing him a toilet insert with arm rest for assist power up. Toileting- Clothing Manipulation and Hygiene: Moderate assistance;With caregiver independent assisting;With adaptive equipment;Cueing for back precautions;Sit to/from stand Toileting - Clothing Manipulation Details (indicate cue type and reason): Pt educated in toilet tongs and technique Tub/ Shower Transfer: Walk-in shower;Min guard   Functional mobility during ADLs: Min guard General ADL Comments: enforced education on back precautions with adls      Vision                     Perception     Praxis      Cognition   Behavior During Therapy: WFL for tasks assessed/performed Overall Cognitive Status: Within Functional Limits for tasks assessed  Extremity/Trunk Assessment               Exercises     Shoulder Instructions       General Comments      Pertinent Vitals/ Pain       Pain Assessment: 0-10 Pain Score: 8  Pain Location: back Pain Descriptors / Indicators: Aching;Discomfort;Guarding Pain Intervention(s): Monitored during session;Limited activity within patient's  tolerance  Home Living                                          Prior Functioning/Environment              Frequency  Min 2X/week        Progress Toward Goals  OT Goals(current goals can now be found in the care plan section)  Progress towards OT goals: Progressing toward goals  Acute Rehab OT Goals Patient Stated Goal: To go home and get back to shag dancing OT Goal Formulation: With patient/family Time For Goal Achievement: 01/09/16 Potential to Achieve Goals: Good  Plan Discharge plan remains appropriate    Co-evaluation                 End of Session Equipment Utilized During Treatment: Rolling walker;Back brace   Activity Tolerance Patient tolerated treatment well   Patient Left in bed;with call bell/phone within reach;with family/visitor present (sitting EOB)   Nurse Communication Mobility status;Precautions        Time: [id number] OT Time Calculation (min): 19 min  Charges: OT General Charges $OT Visit: 1 Procedure OT Treatments $Self Care/Home Management : 8-22 mins  Merri Ray Tatsuo Musial 12/27/2015, 11:04 AM  Hulda Humphrey OTR/L [phone]

## 2015-12-27 NOTE — Progress Notes (Signed)
Pt doing well. Pt given D/C instructions with Rx's, verbal understanding was provided. Pt's incision is clean and dry with no sign of infection. Pt's IV was removed prior to D/C. Pt has corsett brace at the bedside. Pt received RW for home prior to D/C. Pt D/C'd home via wheelchair @ 1105 per MD order. Pt is stable @ D/C and has no other needs at this time. Holli Humbles, RN

## 2015-12-27 NOTE — Discharge Summary (Signed)
Physician Discharge Summary  Patient ID: [name] MRN: [id number] DOB/AGE: 63/63/1952 63 y.o.  Admit date: 12/25/2015 Discharge date: 12/27/2015  Admission Diagnoses: Spinal stenosis, Lumbar region L 34 and L 45 levels with herniated lumbar discs, spondylosis, DDD, radiculopathy   Discharge Diagnoses: Spinal stenosis, Lumbar region L 34 and L 45 levels with herniated lumbar discs, spondylosis, DDD, radiculopathy s/p Right Lumbar three- four, Lumbar four - five Anteriolateral lumbar interbody fusion with Percutaneous pedicle screws (Right) - Right L3-4 L4-5 Anteriolateral lumbar interbody fusion with Percutaneous pedicle screws LUMBAR PERCUTANEOUS PEDICLE SCREW 2 LEVEL (N/A)   Active Problems:   Spinal stenosis, lumbar region, with neurogenic claudication   Discharged Condition: good  Hospital Course: [name] was admitted for surgery with dx stenosis, HNP, and radiculopathy. Following uncomplicated anterolateral interbody fusion L3-5, he recovered well and transferred to [facility] for nursing care and therapies. He has mobilized nicely.   Consults: None  Significant Diagnostic Studies: radiology: X-Ray: intra-op  Treatments: surgery: Right Lumbar three- four, Lumbar four - five Anteriolateral lumbar interbody fusion with Percutaneous pedicle screws (Right) - Right L3-4 L4-5 Anteriolateral lumbar interbody fusion with Percutaneous pedicle screws LUMBAR PERCUTANEOUS PEDICLE SCREW 2 LEVEL (N/A)    Discharge Exam: Blood pressure 110/65, pulse 79, temperature 98.3 F (36.8 C), temperature source Oral, resp. rate 18, height 5\' 10"  (1.778 m), weight 109.8 kg (242 lb), SpO2 94 %. Alert, conversant. Reports much less soreness LLE than pre-op. Lumbar pain well-controlled with po Dilaudid. Incisions without erythema, swelling, or drainage beneath honeycomb &Dermabond. Good strength BLE. No BM yet, but pt reports no discomfort and exhibits no distention. He understands OTC  laxatives are ok to use as needed.     Disposition: 01-Home or Self Care  Pt verbalizes understanding of d/c instructions. He already has f/u appt scheduled with DrStern. Rx's for Dilaudid 2mg  1-2 po q 4-6hrs prn pain &Robaxin 500mg  q8hrs prn spasm. Pt will stop scheduled Tizanidine. Rolling walker with 5" wheels and 3-in-1 elevated commode seat for home use.         Medication List    STOP taking these medications   celecoxib 200 MG capsule Commonly known as:  CELEBREX   GLUCOS-CHONDROIT-HYALURON-MSM PO     TAKE these medications   albuterol 108 (90 Base) MCG/ACT inhaler Commonly known as:  PROVENTIL HFA;VENTOLIN HFA Inhale 2 puffs into the lungs at bedtime as needed for wheezing or shortness of breath (cough).   ALIGN 4 MG Caps Take 4 mg by mouth daily.   aspirin 81 MG tablet Take 81 mg by mouth daily.   atorvastatin 20 MG tablet Commonly known as:  LIPITOR Take 20 mg by mouth every evening.   cholecalciferol 1000 units tablet Commonly known as:  VITAMIN D Take 1,000 Units by mouth daily.   cimetidine 200 MG tablet Commonly known as:  TAGAMET Take 200 mg by mouth at bedtime.   esomeprazole 20 MG capsule Commonly known as:  NEXIUM Take 40 mg by mouth daily.   fluticasone 50 MCG/ACT nasal spray Commonly known as:  FLONASE Place 1 spray into both nostrils daily as needed for allergies or rhinitis (congestion).   gabapentin 400 MG capsule Commonly known as:  NEURONTIN Take 800 mg by mouth 2 (two) times daily.   HYDROcodone-acetaminophen 5-325 MG tablet Commonly known as:  NORCO/VICODIN Take 1 tablet by mouth 3 (three) times daily.   HYDROmorphone 2 MG tablet Commonly known as:  DILAUDID Take 1-2 tablets (2-4 mg total) by mouth every 4 (four)  hours as needed for moderate pain.   KRILL OIL PO Take 350 mg by mouth daily.   lisinopril-hydrochlorothiazide 10-12.5 MG tablet Commonly known as:  PRINZIDE,ZESTORETIC Take 1 tablet by mouth daily.    loratadine 10 MG tablet Commonly known as:  CLARITIN Take 10 mg by mouth daily.   methocarbamol 500 MG tablet Commonly known as:  ROBAXIN Take 1 tablet (500 mg total) by mouth every 6 (six) hours as needed for muscle spasms.   tamsulosin 0.4 MG Caps capsule Commonly known as:  FLOMAX Take 0.4 mg by mouth daily with breakfast.   tiZANidine 2 MG tablet Commonly known as:  ZANAFLEX Take 2 mg by mouth 2 (two) times daily.   traZODone 150 MG tablet Commonly known as:  DESYREL Take 150 mg by mouth at bedtime. For sleep   zolpidem 10 MG tablet Commonly known as:  AMBIEN Take 10 mg by mouth at bedtime.        Signed: Peggyann Shoals, MD 12/27/2015, 10:10 AM

## 2015-12-27 NOTE — Progress Notes (Addendum)
Subjective: Patient reports "I feel pretty good"  Objective: Vital signs in last 24 hours: Temp:  [98.3 F (36.8 C)-100.2 F (37.9 C)] 98.3 F (36.8 C) (10/05 0410) Pulse Rate:  [76-98] 79 (10/05 0410) Resp:  [18-20] 18 (10/05 0410) BP: (95-123)/(61-80) 110/65 (10/05 0410) SpO2:  [94 %-96 %] 94 % (10/05 0410)  Intake/Output from previous day: No intake/output data recorded. Intake/Output this shift: No intake/output data recorded.  Alert, conversant. Reports much less soreness LLE than pre-op. Lumbar pain well-controlled with po Dilaudid. Incisions without erythema, swelling, or drainage beneath honeycomb & Dermabond. Good strength BLE. No BM yet, but pt reports no discomfort and exhibits no distention. He understands OTC laxatives are ok to use as needed.   Lab Results: No results for input(s): WBC, HGB, HCT, PLT in the last 72 hours. BMET No results for input(s): NA, K, CL, CO2, GLUCOSE, BUN, CREATININE, CALCIUM in the last 72 hours.  Studies/Results: Dg Lumbar Spine 2-3 Views  Result Date: 12/25/2015 CLINICAL DATA:  Status post L3-4 and L4-5 XL IF with posterior pedicle screw and rod placement. Fluoro time reported is 4 minutes 19 seconds. Five fluoro spot images are reviewed. EXAM: DG C-ARM 61-120 MIN; LUMBAR SPINE - 2-3 VIEW COMPARISON:  Lumbar spine series of November 21, 2015 FINDINGS: The patient has undergone inter discal device placement at L3-4 and at L4-5. Pedicle screw and connecting rod fixation has been performed at these levels bilaterally. IMPRESSION: There is no immediate postprocedure complication following posterior pedicle and interbody fusion at L3-4 and L4-5. Electronically Signed   By: David  Martinique M.D.   On: 12/25/2015 16:49   Dg C-arm 61-120 Min  Result Date: 12/25/2015 CLINICAL DATA:  Status post L3-4 and L4-5 XL IF with posterior pedicle screw and rod placement. Fluoro time reported is 4 minutes 19 seconds. Five fluoro spot images are reviewed. EXAM: DG  C-ARM 61-120 MIN; LUMBAR SPINE - 2-3 VIEW COMPARISON:  Lumbar spine series of November 21, 2015 FINDINGS: The patient has undergone inter discal device placement at L3-4 and at L4-5. Pedicle screw and connecting rod fixation has been performed at these levels bilaterally. IMPRESSION: There is no immediate postprocedure complication following posterior pedicle and interbody fusion at L3-4 and L4-5. Electronically Signed   By: David  Martinique M.D.   On: 12/25/2015 16:49    Assessment/Plan: Improved  LOS: 2 days  Per DrStern, d/c IV, d/c to home. Pt verbalizes understanding of d/c instructions. He already has f/u appt scheduled with DrStern. Rx's for Dilaudid 2mg  1-2 po q 4-6hrs prn pain & Robaxin 500mg  q8hrs prn spasm. Pt will stop scheduled Tizanidine.    [name] 12/27/2015, 7:54 AM  Doing well.  Discharge home.

## 2016-04-11 ENCOUNTER — Ambulatory Visit: Payer: 59 | Admitting: Neurology

## 2016-04-22 ENCOUNTER — Ambulatory Visit (INDEPENDENT_AMBULATORY_CARE_PROVIDER_SITE_OTHER): Payer: 59 | Admitting: Neurology

## 2016-04-22 ENCOUNTER — Encounter: Payer: Self-pay | Admitting: Neurology

## 2016-04-22 VITALS — BP 110/64 | HR 68 | Ht 71.0 in | Wt 233.1 lb

## 2016-04-22 DIAGNOSIS — G20 Parkinson's disease: Secondary | ICD-10-CM | POA: Diagnosis not present

## 2016-04-22 MED ORDER — PRAMIPEXOLE DIHYDROCHLORIDE 0.125 MG PO TABS: 0.1250 mg | ORAL_TABLET | Freq: Three times a day (TID) | ORAL | 5 refills | Status: DC

## 2016-04-22 NOTE — Patient Instructions (Addendum)
1.  Start Mirapex 0.125mg  tablets    Morning       Afternoon        Evening  Week 1                                  1 tab              Week 2 1 tab                   1 tab              Continue  1 tab          1 tab            1 tab         Call with update in 1 month, to determine further increase in medication.  If you develop increased sleepiness or lightheadedness stay at the lower dose.           2.  MRI brain wo contrast  Return to clinic in 4 months
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
## 2016-05-16 ENCOUNTER — Ambulatory Visit
Admission: RE | Admit: 2016-05-16 | Discharge: 2016-05-16 | Disposition: A | Discharge status: Home / Self Care | Payer: 59 | Source: Ambulatory Visit | Attending: Neurology | Admitting: Neurology

## 2016-05-16 DIAGNOSIS — G20 Parkinson's disease: Secondary | ICD-10-CM

## 2016-05-23 ENCOUNTER — Other Ambulatory Visit: Payer: Self-pay | Admitting: Neurosurgery

## 2016-05-23 DIAGNOSIS — M5416 Radiculopathy, lumbar region: Secondary | ICD-10-CM

## 2016-05-27 ENCOUNTER — Ambulatory Visit
Admission: RE | Admit: 2016-05-27 | Discharge: 2016-05-27 | Disposition: A | Discharge status: Home / Self Care | Payer: 59 | Source: Ambulatory Visit | Attending: Neurosurgery | Admitting: Neurosurgery

## 2016-05-27 DIAGNOSIS — M5416 Radiculopathy, lumbar region: Secondary | ICD-10-CM

## 2016-07-14 ENCOUNTER — Other Ambulatory Visit: Payer: Self-pay | Admitting: Neurosurgery

## 2016-07-18 ENCOUNTER — Telehealth: Payer: Self-pay | Admitting: Vascular Surgery

## 2016-07-18 NOTE — Telephone Encounter (Signed)
Scheduled 5/22@830am

## 2016-07-18 NOTE — Telephone Encounter (Signed)
-----   Message from Gregery Na, RN sent at 07/17/2016  5:04 PM EDT ----- Regarding: OV with TFE Mr. Brandenburg is scheduled for an ALIF L5-S1 with Drs. Early Vertell Limber on 08/15/16. Please schedule office visit with Dr. Donnetta Hutching at least 2 weeks prior to the procedure. Also, please remind patient to bring LS spine films to appt.  Thanks, Colletta Maryland

## 2016-07-25 ENCOUNTER — Other Ambulatory Visit: Payer: Self-pay

## 2016-08-05 ENCOUNTER — Encounter: Payer: Self-pay | Admitting: Vascular Surgery

## 2016-08-07 ENCOUNTER — Encounter (HOSPITAL_COMMUNITY): Payer: Self-pay

## 2016-08-07 ENCOUNTER — Encounter (HOSPITAL_COMMUNITY)
Admission: RE | Admit: 2016-08-07 | Discharge: 2016-08-07 | Disposition: A | Discharge status: Home / Self Care | Payer: 59 | Source: Ambulatory Visit | Attending: Neurosurgery | Admitting: Neurosurgery

## 2016-08-07 DIAGNOSIS — Z01810 Encounter for preprocedural cardiovascular examination: Secondary | ICD-10-CM | POA: Insufficient documentation

## 2016-08-07 DIAGNOSIS — I10 Essential (primary) hypertension: Secondary | ICD-10-CM | POA: Diagnosis not present

## 2016-08-07 DIAGNOSIS — Z01812 Encounter for preprocedural laboratory examination: Secondary | ICD-10-CM | POA: Insufficient documentation

## 2016-08-07 HISTORY — DX: Dyspnea, unspecified: R06.00

## 2016-08-07 HISTORY — DX: Other seasonal allergic rhinitis: J30.2

## 2016-08-07 LAB — BASIC METABOLIC PANEL
ANION GAP: 8 (ref 5–15)
BUN: 19 mg/dL (ref 6–20)
CALCIUM: 9.2 mg/dL (ref 8.9–10.3)
CO2: 29 mmol/L (ref 22–32)
CREATININE: 0.81 mg/dL (ref 0.61–1.24)
Chloride: 99 mmol/L — ABNORMAL LOW (ref 101–111)
GFR calc Af Amer: >60 mL/min (ref >60)
GLUCOSE: 96 mg/dL (ref 65–99)
Potassium: 4.1 mmol/L (ref 3.5–5.1)
SODIUM: 136 mmol/L (ref 135–145)

## 2016-08-07 LAB — SURGICAL PCR SCREEN
MRSA, PCR: NEGATIVE
STAPHYLOCOCCUS AUREUS: NEGATIVE

## 2016-08-07 LAB — CBC
HCT: 39.2 % (ref 39.0–52.0)
Hemoglobin: 12.8 g/dL — ABNORMAL LOW (ref 13.0–17.0)
MCH: 28.5 pg (ref 26.0–34.0)
MCHC: 32.7 g/dL (ref 30.0–36.0)
MCV: 87.3 fL (ref 78.0–100.0)
PLATELETS: 204 10*3/uL (ref 150–400)
RBC: 4.49 MIL/uL (ref 4.22–5.81)
RDW: 13.5 % (ref 11.5–15.5)
WBC: 8.6 10*3/uL (ref 4.0–10.5)

## 2016-08-07 LAB — TYPE AND SCREEN
ABO/RH(D): O POS
ANTIBODY SCREEN: NEGATIVE

## 2016-08-07 NOTE — Progress Notes (Signed)
[name] denies chest pain, shortness of breath with asthma flares, not currently sort of breath.  PCP is Dr Chauncey Cruel. Olen Pel.

## 2016-08-07 NOTE — Pre-Procedure Instructions (Signed)
    [name]  08/07/2016        Your procedure is scheduled on Friday, May 25.  Report to [facility] Admitting at 5:30 AM                 Your surgery or procedure is scheduled for 7:30 AM   Call this number if you have problems the morning of surgery:  [phone]                For any other questions, please call [phone], Monday - Friday 8 AM - 4 PM.     Remember:  Do not eat food or drink liquids after midnight Thursday, May 24.  Take these medicines the morning of surgery with A SIP OF WATER :esomeprazole (NEXIUM), gabapentin (NEURONTIN), tiZANidine (ZANAFLEX), loratadine (CLARITIN).                    Take if needed: Oxycodone, fluticasone (FLONASE), acetaminophen (TYLENOL), albuterol (PROVENTIL HFA;VENTOLIN HFA) Inhaler- bring it to the hospital with you.                1 Week prior to surgery STOP taking Aspirin, Aspirin Products (Goody Powder, Excedrin Migraine), Ibuprofen (Advil), Naproxen (Aleve),celecoxib (CELEBREX), diclofenac (VOLTAREN),  Vitamins and Herbal Products (ie Fish Oil)   Do not wear jewelry, make-up or nail polish.  Do not wear lotions, powders, or perfumes, or deodorant.  Do not shave 48 hours prior to surgery.  Men may shave face and neck.  Do not bring valuables to the hospital.  [facility] is not responsible for any belongings or valuables.  Contacts, dentures or bridgework may not be worn into surgery.  Leave your suitcase in the car.  After surgery it may be brought to your room.  For patients admitted to the hospital, discharge time will be determined by your treatment team.  Special instructions:  Review  [facility] - Preparing For Surgery.  Please read over the following fact sheets that you were given. - Preparing For Surgery and Patient Instructions for Mupirocin Application, Coughing and Deep Breathing, Pain Booklet

## 2016-08-12 ENCOUNTER — Ambulatory Visit (INDEPENDENT_AMBULATORY_CARE_PROVIDER_SITE_OTHER): Payer: 59 | Admitting: Vascular Surgery

## 2016-08-12 ENCOUNTER — Encounter: Payer: Self-pay | Admitting: Vascular Surgery

## 2016-08-12 VITALS — BP 115/83 | HR 89 | Temp 97.8°F | Resp 16 | Ht 71.0 in | Wt 232.0 lb

## 2016-08-12 DIAGNOSIS — M5137 Other intervertebral disc degeneration, lumbosacral region: Secondary | ICD-10-CM | POA: Diagnosis not present

## 2016-08-12 NOTE — Progress Notes (Signed)
Vascular and Vein Specialist of   Patient name: JAIRE PINKHAM MRN: [id number] DOB: 08/09/1950 Sex: male  REASON FOR CONSULT: Discuss anterior exposure for L5-S1 spine surgery  HPI: KOSTA SCHNITZLER is a 66 y.o. male, who is here today for discussion of L5-S1 spine surgery. He has progressive degenerative disc disease is seen Dr. Vertell Limber who is recommended anterior exposure for L5-S1 fusion. He does have a history of prior fusion from the posterior standpoint from higher level. He has pain in his back and also has progressive pain extending down both lower extremity switches limiting to him. He has failed conservative treatment and his head and is scheduled for surgery on 525. He is here today for evaluation discussion my role for exposure. He does have a remote history of appendectomy. No other intra-abdominal surgery. No history of cardiac disease.  Past Medical History:  Diagnosis Date  . Abdominal pain   . Allergic rhinitis, cause unspecified   . Asthma   . Benign essential hypertension    Low salt diet. Systolic goal <300, diastolic goal <76.  Marland Kitchen Blood in stool   . Cancer ([facility])    basal cell removed several places   . Carpal tunnel syndrome, bilateral   . Cervicalgia   . Chest discomfort    Could be related to hiatal hernia but because of Hx of HTN, HLD and + FHX, an exercise stress test & referral to cardiology will be arranged.  . Chronic back pain    On celebrex  . Chronic insomnia   . Chronic neck pain    On celebrex  . Complication of anesthesia    woke up slowly after deviated septum surg. Frances Maywood.   . Degenerative disk disease    degenerative in cervical area & lumbar area  . Diarrhea   . Diverticulitis   . Diverticulosis   . Dizziness    occational  . DOE (dyspnea on exertion)    occasional. Now can also feel when he is at rest. Former smoker.  Marland Kitchen Dyspepsia   . Dyspnea   . Elevated blood pressure 03/02/13   (x) 2 years.  Referral to cardiology  . 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 [name]  . Esophageal reflux   . 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 Gastric ulcer   . Hiatal hernia   . History of kidney stones    passed - 5 times  . HOH (hard of hearing)   . Hyperlipidemia    Last FLP in 2012 slightly elevated. LDL goal <100.  . Midsternal chest pain    Has had for a while, which he contributed to hiatal hernia and peptic disease. No radiation, not sure about duration, not associated with diaphoresis.  . Pain in joint, site unspecified   . Peptic disease   . Seasonal allergies   . Sleep apnea syndrome    was to try cpap-did not get one-repeat studies could not sleep  . Sleep disturbance, unspecified   . SOB (shortness of breath)   . Tremor    Per Dr. [name]  . Ventral hernia    never corrected     Family History  Problem Relation Age of Onset  . Heart attack Father        Deceased, 47  . Alzheimer's disease Mother        Deceased, 51  .  CAD Unknown   . CAD Brother   . Hypertension Brother   . Hyperlipidemia Brother   . Hyperlipidemia Sister   . Hypertension Sister     SOCIAL HISTORY: Social History   Social History  . Marital status: Married    Spouse name: N/A  . Number of children: N/A  . Years of education: N/A   Occupational History  . Not on file.   Social History Main Topics  . Smoking status: Former Smoker    Packs/day: 0.75    Years: 15.00    Types: Cigarettes    Quit date: 03/25/1991  . Smokeless tobacco: Never Used  . Alcohol use 1.8 oz/week    1 Glasses of wine, 1 Cans of beer, 1 Shots of liquor per week     Comment: 3 drinks per week  . Drug use: No  . Sexual activity: Not on file   Other Topics Concern  . 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    Highest level of education:  1 year of college   He lives with wife. No children.          No Known Allergies  Current Outpatient Prescriptions  Medication Sig Dispense Refill  . acetaminophen (TYLENOL) 325 MG tablet Take 325 mg by mouth every 6 (six) hours as needed for mild pain.    Marland Kitchen albuterol (PROVENTIL HFA;VENTOLIN HFA) 108 (90 BASE) MCG/ACT inhaler Inhale 2 puffs into the lungs every 6 (six) hours as needed for wheezing or shortness of breath (cough).     Marland Kitchen atorvastatin (LIPITOR) 20 MG tablet Take 20 mg by mouth every evening.     . celecoxib (CELEBREX) 200 MG capsule Take 200 mg by mouth daily.    . diclofenac (VOLTAREN) 75 MG EC tablet Take 75 mg by mouth daily as needed for mild pain.     Marland Kitchen esomeprazole (NEXIUM) 20 MG capsule Take 40 mg by mouth daily.  0  . fluticasone (FLONASE) 50 MCG/ACT nasal spray Place 2 sprays into both nostrils daily as needed for allergies or rhinitis (congestion).     . gabapentin (NEURONTIN) 400 MG capsule Take 800 mg by mouth 2 (two) times daily.    Marland Kitchen lisinopril-hydrochlorothiazide (PRINZIDE,ZESTORETIC) 10-12.5 MG per tablet Take 1 tablet by mouth daily.    Marland Kitchen loratadine (CLARITIN) 10 MG tablet Take 10 mg by mouth daily.     . methocarbamol (ROBAXIN) 500 MG tablet Take 1 tablet (500 mg total) by mouth every 6 (six) hours as needed for muscle spasms. 60 tablet 1  . Oxycodone HCl 10 MG TABS TAKE 1 TABLET BY MOUTH AS NEEDED 3 TIMES A DAY PER DAY FOR PAIN  0  . pramipexole (MIRAPEX) 0.125 MG tablet Take 1 tablet (0.125 mg total) by mouth 3 (three) times daily. 90 tablet 5  . Probiotic Product (ALIGN) 4 MG CAPS Take 4 mg by mouth daily.     . ranitidine (ZANTAC) 150 MG tablet Take 150 mg by mouth at bedtime.    . tamsulosin (FLOMAX) 0.4 MG CAPS capsule Take 0.4 mg by mouth daily with breakfast.  6  . tiZANidine (ZANAFLEX) 2 MG tablet Take 2 mg by mouth 2 (two) times daily.    . traZODone (DESYREL) 150 MG tablet Take 150 mg by mouth at bedtime. For sleep      . zolpidem (AMBIEN) 10 MG  tablet Take 10 mg by mouth at bedtime.    Marland Kitchen HYDROmorphone (DILAUDID) 2 MG tablet Take 1-2 tablets (2-4 mg total) by mouth every 4 (four) hours as needed for moderate pain. (Patient not taking: Reported on 08/12/2016) 60 tablet 0   No current facility-administered medications for this visit.     REVIEW OF SYSTEMS:  [X]  denotes positive finding, [ ]  denotes negative finding Cardiac  Comments:  Chest pain or chest pressure:    Shortness of breath upon exertion:    Short of breath when lying flat:    Irregular heart rhythm:        Vascular    Pain in calf, thigh, or hip brought on by ambulation:    Pain in feet at night that wakes you up from your sleep:     Blood clot in your veins:    Leg swelling:         Pulmonary    Oxygen at home:    Productive cough:     Wheezing:         Neurologic    Sudden weakness in arms or legs:     Sudden numbness in arms or legs:     Sudden onset of difficulty speaking or slurred speech:    Temporary loss of vision in one eye:     Problems with dizziness:         Gastrointestinal    Blood in stool:     Vomited blood:         Genitourinary    Burning when urinating:     Blood in urine:        Psychiatric    Major depression:         Hematologic    Bleeding problems:    Problems with blood clotting too easily:        Skin    Rashes or ulcers:        Constitutional    Fever or chills:      PHYSICAL EXAM: Vitals:   08/12/16 1344  BP: 115/83  Pulse: 89  Resp: 16  Temp: 97.8 F (36.6 C)  TempSrc: Oral  SpO2: 93%  Weight: 232 lb (105.2 kg)  Height: 5\' 11"  (1.803 m)    GENERAL: The patient is a well-nourished male, in no acute distress. The vital signs are documented above. CARDIOVASCULAR: Carotid arteries without bruits bilaterally. Radial pulses 2+ bilaterally. Posterior tibial pulses 2+ bilaterally PULMONARY: There is good air exchange  ABDOMEN: Soft and non-tender  MUSCULOSKELETAL: There are no  major deformities or cyanosis. NEUROLOGIC: No focal weakness or paresthesias are detected. SKIN: There are no ulcers or rashes noted. PSYCHIATRIC: The patient has a normal affect.  DATA:  I did review his lumbar CT from March 2018. This shows minimal calcification of his vessels with normal vessel diameter.  MEDICAL ISSUES: Had long discussion regarding my role and exposure for L5-S1 disc surgery. Explain mobilization of the rectus muscle, peritoneal sac, left ureter, arterial and venous structures overlying the spine. Also explain potential injury for all of these. I do not see any contraindication from from the exposure standpoint for spine surgery. Will proceed as planned on 08/15/2016   Rosetta Posner, MD [facility] Vascular and Vein Specialists of [facility] Tel [phone] Pager [phone]

## 2016-08-14 NOTE — H&P (Signed)
  Patient ID:   [id number] Patient: [name]  Date of Birth: 1950/08/16 Visit Type: Office Visit   Date: 07/09/2016 10:45 AM Provider: Marchia Meiers. Vertell Limber MD   This 66 year old male presents for Leg pain.  History of Present Illness: 1.  Leg pain    Patient reports left leg pain and occasional right leg pain. Patient notes some improvement from prior XLIF at L3-4 and L4-5 with percutaneous pedicle screws- he says he has less back pain- but leg pain still prevalent.      Medical/Surgical/Interim History Reviewed, no change.  Last detailed document date:01/11/2014.   Family History: Reviewed, no changes.  Last detailed document: 01/11/2014.   Social History: Tobacco use reviewed. Reviewed, no changes. Last detailed document date: 01/11/2014.      MEDICATIONS(added, continued or stopped this visit): Started Medication Directions Instruction Stopped   Align 4 mg capsule take 1 tablet by mouth daily    04/07/2016 diclofenac 75 mg ORAL take one twice a day as needed     fexofenadine 180 mg tablet take 1 tablet by oral route  every day    11/17/2012 Neurontin 400 mg capsule take 2 capsule by oral route 2 times every day  and 1 capsule PO midday ( 5 per day)     Nexium  ORAL     08/16/2015 TIZANIDINE HCL 2 MG TABLET TAKE 1 OR 2 TABLETS BY MOUTH EVERY 8 HOURS AS NEEDED FOR SPASM     trazodone 150 mg tablet take 1 tablet by oral route  every day at bedtime     Vitamin D3 1,000 unit tablet take 1 tablet by mouth daily       ALLERGIES: Ingredient Reaction Medication Name Comment  NO KNOWN ALLERGIES     No known allergies. Reviewed, no changes.    Vitals Date Temp F BP Pulse Ht In Wt Lb BMI BSA Pain Score  07/09/2016  127/75 71 71 233 32.5  7/10      IMPRESSION Lumbar CT reveals severe stenosis bilaterally at L5-S1. I think prior XLIF with pedicle screws at L3-4 and L4-5 helped, however, I would recommend ALIF with possible stand alone vs pedicle screws at L5-S1.  Patient takes gabapentin to help him sleep. I recommend he continue doing so.    Pain Assessment/Treatment Pain Scale: 7/10. Method: Numeric Pain Intensity Scale. Location: leg. Onset: 01/11/1985. Duration: varies. Quality: discomforting. Pain Assessment/Treatment follow-up plan of care: Patient taking medication as prescribed..  Fall Risk Plan The patient has not fallen in the last year.  Nurse education given. Scheduled L5-S1 ALIF possible stand alone vs pedicle screws. Lumbar brace.              Provider:  Marchia Meiers. Vertell Limber MD  07/09/2016 11:25 AM Dictation edited by: Daine Gravel    CC Providers: Robert  Fried [place] [address],  [place]  [address]-   Robert Fried [facility] [address] [place] [address]-              Electronically signed by Marchia Meiers. Vertell Limber MD on 07/12/2016 01:06 PM

## 2016-08-14 NOTE — Anesthesia Preprocedure Evaluation (Addendum)
Anesthesia Evaluation  Patient identified by MRN, date of birth, ID band Patient awake    Reviewed: Allergy & Precautions, NPO status , Patient's Chart, lab work & pertinent test results, reviewed documented beta blocker date and time   Airway Mallampati: I  TM Distance: >3 FB Neck ROM: Full    Dental  (+) Teeth Intact   Pulmonary shortness of breath, asthma , sleep apnea , former smoker,    breath sounds clear to auscultation       Cardiovascular hypertension, Pt. on medications  Rhythm:Regular Rate:Normal  ECHO 2015 EF 70%   Neuro/Psych    GI/Hepatic hiatal hernia, PUD, GERD  Medicated,  Endo/Other    Renal/GU STONES     Musculoskeletal   Abdominal (+)  Abdomen: soft.    Peds  Hematology   Anesthesia Other Findings   Reproductive/Obstetrics                          Lab Results  Component Value Date   WBC 8.6 08/07/2016   HGB 12.8 (L) 08/07/2016   HCT 39.2 08/07/2016   MCV 87.3 08/07/2016   PLT 204 08/07/2016   Lab Results  Component Value Date   CREATININE 0.81 08/07/2016   BUN 19 08/07/2016   NA 136 08/07/2016   K 4.1 08/07/2016   CL 99 (L) 08/07/2016   CO2 29 08/07/2016    Anesthesia Physical  Anesthesia Plan  ASA: II  Anesthesia Plan: General   Post-op Pain Management:    Induction: Intravenous  Airway Management Planned: Oral ETT  Additional Equipment: Arterial line  Intra-op Plan:   Post-operative Plan: Extubation in OR  Informed Consent: I have reviewed the patients History and Physical, chart, labs and discussed the procedure including the risks, benefits and alternatives for the proposed anesthesia with the patient or authorized representative who has indicated his/her understanding and acceptance.   Dental advisory given  Plan Discussed with: CRNA  Anesthesia Plan Comments:        Anesthesia Quick Evaluation

## 2016-08-15 ENCOUNTER — Inpatient Hospital Stay (HOSPITAL_COMMUNITY): Payer: 59 | Admitting: Anesthesiology

## 2016-08-15 ENCOUNTER — Inpatient Hospital Stay (HOSPITAL_COMMUNITY): Payer: 59

## 2016-08-15 ENCOUNTER — Inpatient Hospital Stay (HOSPITAL_COMMUNITY)
Admission: RE | Admit: 2016-08-15 | Discharge: 2016-08-16 | DRG: 460 | Disposition: A | Discharge status: Home / Self Care | Payer: 59 | Source: Ambulatory Visit | Attending: Neurosurgery | Admitting: Neurosurgery

## 2016-08-15 ENCOUNTER — Encounter (HOSPITAL_COMMUNITY)
Admission: RE | Disposition: A | Discharge status: Home / Self Care | Payer: Self-pay | Source: Ambulatory Visit | Attending: Neurosurgery

## 2016-08-15 ENCOUNTER — Encounter (HOSPITAL_COMMUNITY): Payer: Self-pay | Admitting: *Deleted

## 2016-08-15 DIAGNOSIS — M4317 Spondylolisthesis, lumbosacral region: Secondary | ICD-10-CM | POA: Diagnosis present

## 2016-08-15 DIAGNOSIS — M4316 Spondylolisthesis, lumbar region: Secondary | ICD-10-CM | POA: Diagnosis present

## 2016-08-15 DIAGNOSIS — I10 Essential (primary) hypertension: Secondary | ICD-10-CM | POA: Diagnosis present

## 2016-08-15 DIAGNOSIS — M48062 Spinal stenosis, lumbar region with neurogenic claudication: Secondary | ICD-10-CM | POA: Diagnosis present

## 2016-08-15 DIAGNOSIS — M5117 Intervertebral disc disorders with radiculopathy, lumbosacral region: Secondary | ICD-10-CM | POA: Diagnosis present

## 2016-08-15 DIAGNOSIS — Z419 Encounter for procedure for purposes other than remedying health state, unspecified: Secondary | ICD-10-CM

## 2016-08-15 DIAGNOSIS — Z87891 Personal history of nicotine dependence: Secondary | ICD-10-CM

## 2016-08-15 DIAGNOSIS — M5137 Other intervertebral disc degeneration, lumbosacral region: Secondary | ICD-10-CM

## 2016-08-15 DIAGNOSIS — M79604 Pain in right leg: Secondary | ICD-10-CM | POA: Diagnosis present

## 2016-08-15 HISTORY — PX: ABDOMINAL EXPOSURE: SHX5708

## 2016-08-15 HISTORY — PX: ANTERIOR LUMBAR FUSION: SHX1170

## 2016-08-15 SURGERY — ANTERIOR LUMBAR FUSION 1 LEVEL
Anesthesia: General | Site: Spine Lumbar

## 2016-08-15 MED ORDER — FENTANYL CITRATE (PF) 250 MCG/5ML IJ SOLN
INTRAMUSCULAR | Status: AC
  Filled 2016-08-15: qty 5

## 2016-08-15 MED ORDER — PROPOFOL 10 MG/ML IV BOLUS
INTRAVENOUS | Status: DC | PRN
  Administered 2016-08-15: 200 mg via INTRAVENOUS

## 2016-08-15 MED ORDER — 0.9 % SODIUM CHLORIDE (POUR BTL) OPTIME
TOPICAL | Status: DC | PRN
  Administered 2016-08-15: 1000 mL

## 2016-08-15 MED ORDER — SUGAMMADEX SODIUM 200 MG/2ML IV SOLN
INTRAVENOUS | Status: DC | PRN
  Administered 2016-08-15: 200 mg via INTRAVENOUS

## 2016-08-15 MED ORDER — ACETAMINOPHEN 650 MG RE SUPP: 650.0000 mg | RECTAL | Status: DC | PRN

## 2016-08-15 MED ORDER — ONDANSETRON HCL 4 MG/2ML IJ SOLN
INTRAMUSCULAR | Status: DC | PRN
  Administered 2016-08-15: 4 mg via INTRAVENOUS

## 2016-08-15 MED ORDER — LISINOPRIL 20 MG PO TABS
10.0000 mg | ORAL_TABLET | Freq: Every day | ORAL | Status: DC
  Administered 2016-08-15: 10 mg via ORAL
  Filled 2016-08-15: qty 1

## 2016-08-15 MED ORDER — HYDROMORPHONE HCL 1 MG/ML IJ SOLN
INTRAMUSCULAR | Status: AC
  Filled 2016-08-15: qty 1

## 2016-08-15 MED ORDER — TRAZODONE HCL 150 MG PO TABS
150.0000 mg | ORAL_TABLET | Freq: Every day | ORAL | Status: DC
  Filled 2016-08-15: qty 1

## 2016-08-15 MED ORDER — SODIUM CHLORIDE 0.9% FLUSH: 3.0000 mL | INTRAVENOUS | Status: DC | PRN

## 2016-08-15 MED ORDER — FENTANYL CITRATE (PF) 100 MCG/2ML IJ SOLN
INTRAMUSCULAR | Status: DC | PRN
  Administered 2016-08-15: 50 ug via INTRAVENOUS
  Administered 2016-08-15: 100 ug via INTRAVENOUS
  Administered 2016-08-15 (×2): 50 ug via INTRAVENOUS

## 2016-08-15 MED ORDER — ROCURONIUM BROMIDE 10 MG/ML (PF) SYRINGE
PREFILLED_SYRINGE | INTRAVENOUS | Status: AC
  Filled 2016-08-15: qty 5

## 2016-08-15 MED ORDER — ALBUTEROL SULFATE (2.5 MG/3ML) 0.083% IN NEBU: 2.5000 mg | INHALATION_SOLUTION | Freq: Four times a day (QID) | RESPIRATORY_TRACT | Status: DC | PRN

## 2016-08-15 MED ORDER — CHLORHEXIDINE GLUCONATE 4 % EX LIQD: 60.0000 mL | Freq: Once | CUTANEOUS | Status: DC

## 2016-08-15 MED ORDER — KCL IN DEXTROSE-NACL 20-5-0.45 MEQ/L-%-% IV SOLN
INTRAVENOUS | Status: DC
  Administered 2016-08-15: 12:00:00 via INTRAVENOUS

## 2016-08-15 MED ORDER — MEPERIDINE HCL 25 MG/ML IJ SOLN
6.2500 mg | INTRAMUSCULAR | Status: DC | PRN
Start: 2016-08-15 — End: 2016-08-15
  Administered 2016-08-15 (×2): 6.25 mg via INTRAVENOUS

## 2016-08-15 MED ORDER — TIZANIDINE HCL 4 MG PO TABS
2.0000 mg | ORAL_TABLET | Freq: Two times a day (BID) | ORAL | Status: DC
  Administered 2016-08-15: 2 mg via ORAL
  Filled 2016-08-15: qty 1

## 2016-08-15 MED ORDER — SUGAMMADEX SODIUM 200 MG/2ML IV SOLN
INTRAVENOUS | Status: AC
  Filled 2016-08-15: qty 2

## 2016-08-15 MED ORDER — MIDAZOLAM HCL 2 MG/2ML IJ SOLN
INTRAMUSCULAR | Status: AC
  Filled 2016-08-15: qty 2

## 2016-08-15 MED ORDER — ACETAMINOPHEN 325 MG PO TABS: 325.0000 mg | ORAL_TABLET | Freq: Four times a day (QID) | ORAL | Status: DC | PRN

## 2016-08-15 MED ORDER — RISAQUAD PO CAPS
1.0000 | ORAL_CAPSULE | Freq: Every day | ORAL | Status: DC
  Filled 2016-08-15: qty 1

## 2016-08-15 MED ORDER — PROMETHAZINE HCL 25 MG/ML IJ SOLN: 6.2500 mg | INTRAMUSCULAR | Status: DC | PRN

## 2016-08-15 MED ORDER — LORATADINE 10 MG PO TABS
10.0000 mg | ORAL_TABLET | Freq: Every day | ORAL | Status: DC
  Administered 2016-08-15: 10 mg via ORAL
  Filled 2016-08-15: qty 1

## 2016-08-15 MED ORDER — CEFAZOLIN SODIUM-DEXTROSE 2-4 GM/100ML-% IV SOLN
INTRAVENOUS | Status: AC
  Filled 2016-08-15: qty 100

## 2016-08-15 MED ORDER — METHOCARBAMOL 750 MG PO TABS
750.0000 mg | ORAL_TABLET | Freq: Four times a day (QID) | ORAL | Status: DC | PRN
  Administered 2016-08-15 – 2016-08-16 (×3): 750 mg via ORAL
  Filled 2016-08-15 (×3): qty 1

## 2016-08-15 MED ORDER — HYDROMORPHONE HCL 1 MG/ML IJ SOLN
0.2500 mg | INTRAMUSCULAR | Status: DC | PRN
  Administered 2016-08-15 (×4): 0.5 mg via INTRAVENOUS

## 2016-08-15 MED ORDER — ONDANSETRON HCL 4 MG/2ML IJ SOLN: 4.0000 mg | Freq: Four times a day (QID) | INTRAMUSCULAR | Status: DC | PRN

## 2016-08-15 MED ORDER — ACETAMINOPHEN 325 MG PO TABS: 650.0000 mg | ORAL_TABLET | ORAL | Status: DC | PRN

## 2016-08-15 MED ORDER — OXYCODONE HCL 5 MG PO TABS
10.0000 mg | ORAL_TABLET | Freq: Four times a day (QID) | ORAL | Status: DC | PRN
  Administered 2016-08-15: 10 mg via ORAL
  Filled 2016-08-15: qty 2

## 2016-08-15 MED ORDER — SODIUM CHLORIDE 0.9% FLUSH
3.0000 mL | Freq: Two times a day (BID) | INTRAVENOUS | Status: DC
  Administered 2016-08-15: 3 mL via INTRAVENOUS

## 2016-08-15 MED ORDER — HYDROMORPHONE HCL 2 MG PO TABS
2.0000 mg | ORAL_TABLET | ORAL | Status: DC | PRN
  Administered 2016-08-15: 4 mg via ORAL
  Filled 2016-08-15: qty 2

## 2016-08-15 MED ORDER — LIDOCAINE 2% (20 MG/ML) 5 ML SYRINGE
INTRAMUSCULAR | Status: AC
  Filled 2016-08-15: qty 5

## 2016-08-15 MED ORDER — DEXAMETHASONE SODIUM PHOSPHATE 10 MG/ML IJ SOLN
INTRAMUSCULAR | Status: DC | PRN
  Administered 2016-08-15: 10 mg via INTRAVENOUS

## 2016-08-15 MED ORDER — PHENYLEPHRINE 40 MCG/ML (10ML) SYRINGE FOR IV PUSH (FOR BLOOD PRESSURE SUPPORT)
PREFILLED_SYRINGE | INTRAVENOUS | Status: AC
  Filled 2016-08-15: qty 10

## 2016-08-15 MED ORDER — CHLORHEXIDINE GLUCONATE CLOTH 2 % EX PADS: 6.0000 | MEDICATED_PAD | Freq: Once | CUTANEOUS | Status: DC

## 2016-08-15 MED ORDER — EPHEDRINE 5 MG/ML INJ
INTRAVENOUS | Status: AC
  Filled 2016-08-15: qty 10

## 2016-08-15 MED ORDER — HYDROMORPHONE HCL 1 MG/ML IJ SOLN
1.0000 mg | INTRAMUSCULAR | Status: DC | PRN
  Administered 2016-08-16: 1 mg via INTRAVENOUS
  Filled 2016-08-15: qty 1

## 2016-08-15 MED ORDER — CEFAZOLIN SODIUM-DEXTROSE 2-4 GM/100ML-% IV SOLN
2.0000 g | Freq: Three times a day (TID) | INTRAVENOUS | Status: AC
  Administered 2016-08-15 (×2): 2 g via INTRAVENOUS
  Filled 2016-08-15 (×2): qty 100

## 2016-08-15 MED ORDER — HYDROCHLOROTHIAZIDE 12.5 MG PO CAPS
12.5000 mg | ORAL_CAPSULE | Freq: Every day | ORAL | Status: DC
  Administered 2016-08-15: 12.5 mg via ORAL
  Filled 2016-08-15: qty 1

## 2016-08-15 MED ORDER — FAMOTIDINE 20 MG PO TABS
20.0000 mg | ORAL_TABLET | Freq: Two times a day (BID) | ORAL | Status: DC
  Administered 2016-08-15 – 2016-08-16 (×3): 20 mg via ORAL
  Filled 2016-08-15 (×3): qty 1

## 2016-08-15 MED ORDER — THROMBIN 5000 UNITS EX SOLR
CUTANEOUS | Status: DC | PRN
  Administered 2016-08-15 (×2): 5000 [IU] via TOPICAL

## 2016-08-15 MED ORDER — OXYCODONE-ACETAMINOPHEN 5-325 MG PO TABS
1.0000 | ORAL_TABLET | ORAL | Status: DC | PRN
  Administered 2016-08-15 – 2016-08-16 (×3): 2 via ORAL
  Filled 2016-08-15 (×3): qty 2

## 2016-08-15 MED ORDER — LISINOPRIL-HYDROCHLOROTHIAZIDE 10-12.5 MG PO TABS: 1.0000 | ORAL_TABLET | Freq: Every day | ORAL | Status: DC

## 2016-08-15 MED ORDER — HEMOSTATIC AGENTS (NO CHARGE) OPTIME
TOPICAL | Status: DC | PRN
  Administered 2016-08-15: 1 via TOPICAL

## 2016-08-15 MED ORDER — ZOLPIDEM TARTRATE 5 MG PO TABS
10.0000 mg | ORAL_TABLET | Freq: Every day | ORAL | Status: DC
  Filled 2016-08-15: qty 2

## 2016-08-15 MED ORDER — MEPERIDINE HCL 25 MG/ML IJ SOLN
INTRAMUSCULAR | Status: AC
  Filled 2016-08-15: qty 1

## 2016-08-15 MED ORDER — FLUTICASONE PROPIONATE 50 MCG/ACT NA SUSP: 2.0000 | Freq: Every day | NASAL | Status: DC | PRN

## 2016-08-15 MED ORDER — PHENOL 1.4 % MT LIQD: 1.0000 | OROMUCOSAL | Status: DC | PRN

## 2016-08-15 MED ORDER — THROMBIN 5000 UNITS EX SOLR
CUTANEOUS | Status: AC
  Filled 2016-08-15: qty 10000

## 2016-08-15 MED ORDER — CEFAZOLIN SODIUM-DEXTROSE 2-4 GM/100ML-% IV SOLN
2.0000 g | INTRAVENOUS | Status: AC
  Administered 2016-08-15: 2 g via INTRAVENOUS

## 2016-08-15 MED ORDER — ONDANSETRON HCL 4 MG/2ML IJ SOLN
INTRAMUSCULAR | Status: AC
  Filled 2016-08-15: qty 2

## 2016-08-15 MED ORDER — HYDROCODONE-ACETAMINOPHEN 10-325 MG PO TABS: 1.0000 | ORAL_TABLET | ORAL | Status: DC | PRN

## 2016-08-15 MED ORDER — METHOCARBAMOL 500 MG PO TABS: 500.0000 mg | ORAL_TABLET | Freq: Four times a day (QID) | ORAL | Status: DC | PRN

## 2016-08-15 MED ORDER — ATORVASTATIN CALCIUM 20 MG PO TABS
20.0000 mg | ORAL_TABLET | Freq: Every evening | ORAL | Status: DC
  Administered 2016-08-15: 20 mg via ORAL
  Filled 2016-08-15: qty 1

## 2016-08-15 MED ORDER — EPHEDRINE SULFATE-NACL 50-0.9 MG/10ML-% IV SOSY
PREFILLED_SYRINGE | INTRAVENOUS | Status: DC | PRN
  Administered 2016-08-15: 5 mg via INTRAVENOUS
  Administered 2016-08-15: 10 mg via INTRAVENOUS

## 2016-08-15 MED ORDER — PRAMIPEXOLE DIHYDROCHLORIDE 0.125 MG PO TABS: 0.1250 mg | ORAL_TABLET | Freq: Three times a day (TID) | ORAL | Status: DC

## 2016-08-15 MED ORDER — HYDROMORPHONE HCL 2 MG PO TABS: 2.0000 mg | ORAL_TABLET | ORAL | Status: DC | PRN

## 2016-08-15 MED ORDER — TAMSULOSIN HCL 0.4 MG PO CAPS: 0.4000 mg | ORAL_CAPSULE | Freq: Every day | ORAL | Status: DC

## 2016-08-15 MED ORDER — MIDAZOLAM HCL 5 MG/5ML IJ SOLN
INTRAMUSCULAR | Status: DC | PRN
  Administered 2016-08-15: 2 mg via INTRAVENOUS

## 2016-08-15 MED ORDER — DEXAMETHASONE SODIUM PHOSPHATE 10 MG/ML IJ SOLN
INTRAMUSCULAR | Status: AC
  Filled 2016-08-15: qty 1

## 2016-08-15 MED ORDER — ONDANSETRON HCL 4 MG PO TABS: 4.0000 mg | ORAL_TABLET | Freq: Four times a day (QID) | ORAL | Status: DC | PRN

## 2016-08-15 MED ORDER — LIDOCAINE HCL (CARDIAC) 20 MG/ML IV SOLN
INTRAVENOUS | Status: DC | PRN
  Administered 2016-08-15: 60 mg via INTRAVENOUS

## 2016-08-15 MED ORDER — KCL IN DEXTROSE-NACL 20-5-0.45 MEQ/L-%-% IV SOLN
INTRAVENOUS | Status: AC
  Filled 2016-08-15: qty 1000

## 2016-08-15 MED ORDER — ROCURONIUM BROMIDE 100 MG/10ML IV SOLN
INTRAVENOUS | Status: DC | PRN
  Administered 2016-08-15: 40 mg via INTRAVENOUS
  Administered 2016-08-15: 60 mg via INTRAVENOUS

## 2016-08-15 MED ORDER — MENTHOL 3 MG MT LOZG: 1.0000 | LOZENGE | OROMUCOSAL | Status: DC | PRN

## 2016-08-15 MED ORDER — PHENYLEPHRINE HCL 10 MG/ML IJ SOLN
INTRAMUSCULAR | Status: DC | PRN
  Administered 2016-08-15: 80 ug via INTRAVENOUS
  Administered 2016-08-15: 40 ug via INTRAVENOUS

## 2016-08-15 MED ORDER — PANTOPRAZOLE SODIUM 40 MG PO TBEC
40.0000 mg | DELAYED_RELEASE_TABLET | Freq: Every day | ORAL | Status: DC
  Administered 2016-08-15 – 2016-08-16 (×2): 40 mg via ORAL
  Filled 2016-08-15 (×2): qty 1

## 2016-08-15 MED ORDER — ALUM & MAG HYDROXIDE-SIMETH 200-200-20 MG/5ML PO SUSP: 30.0000 mL | Freq: Four times a day (QID) | ORAL | Status: DC | PRN

## 2016-08-15 MED ORDER — LACTATED RINGERS IV SOLN
INTRAVENOUS | Status: DC | PRN
  Administered 2016-08-15: 07:00:00 via INTRAVENOUS

## 2016-08-15 MED ORDER — PROPOFOL 10 MG/ML IV BOLUS
INTRAVENOUS | Status: AC
  Filled 2016-08-15: qty 20

## 2016-08-15 MED ORDER — GABAPENTIN 400 MG PO CAPS
800.0000 mg | ORAL_CAPSULE | Freq: Two times a day (BID) | ORAL | Status: DC
  Administered 2016-08-15 – 2016-08-16 (×2): 800 mg via ORAL
  Filled 2016-08-15 (×2): qty 2

## 2016-08-15 MED ORDER — PHENYLEPHRINE HCL 10 MG/ML IJ SOLN
INTRAVENOUS | Status: DC | PRN
  Administered 2016-08-15: 20 ug/min via INTRAVENOUS

## 2016-08-15 MED FILL — Heparin Sodium (Porcine) Inj 1000 Unit/ML: INTRAMUSCULAR | Qty: 30 | Status: AC

## 2016-08-15 MED FILL — Sodium Chloride IV Soln 0.9%: INTRAVENOUS | Qty: 1000 | Status: AC

## 2016-08-15 SURGICAL SUPPLY — 58 items
ADH SKN CLS APL DERMABOND .7 (GAUZE/BANDAGES/DRESSINGS) ×2
APPLIER CLIP 11 MED OPEN (CLIP) ×3
APR CLP MED 11 20 MLT OPN (CLIP) ×2
BASE TI BOLT 5.0X22.5 VARIABLE (Bolt) ×3 IMPLANT
BOLT BASE TI 5X20 VARIABLE (Bolt) ×4 IMPLANT
BOLT SPINAL VAR TI FIX 6X20 (Bolt) ×2 IMPLANT
CANISTER SUCT 3000ML PPV (MISCELLANEOUS) ×3 IMPLANT
CLIP APPLIE 11 MED OPEN (CLIP) ×2 IMPLANT
COVER BACK TABLE 60X90IN (DRAPES) ×3 IMPLANT
DERMABOND ADVANCED (GAUZE/BANDAGES/DRESSINGS) ×1
DERMABOND ADVANCED .7 DNX12 (GAUZE/BANDAGES/DRESSINGS) ×2 IMPLANT
DRAPE C-ARM 42X72 X-RAY (DRAPES) ×3 IMPLANT
DRAPE C-ARMOR (DRAPES) ×3 IMPLANT
DRAPE LAPAROTOMY 100X72X124 (DRAPES) ×3 IMPLANT
DRAPE POUCH INSTRU U-SHP 10X18 (DRAPES) ×3 IMPLANT
DRSG OPSITE POSTOP 4X6 (GAUZE/BANDAGES/DRESSINGS) ×3 IMPLANT
DURAPREP 26ML APPLICATOR (WOUND CARE) ×3 IMPLANT
ELECT BLADE 4.0 EZ CLEAN MEGAD (MISCELLANEOUS) ×3
ELECT REM PT RETURN 9FT ADLT (ELECTROSURGICAL) ×3
ELECTRODE BLDE 4.0 EZ CLN MEGD (MISCELLANEOUS) ×2 IMPLANT
ELECTRODE REM PT RTRN 9FT ADLT (ELECTROSURGICAL) ×2 IMPLANT
GAUZE SPONGE 4X4 12PLY STRL (GAUZE/BANDAGES/DRESSINGS) IMPLANT
GLOVE BIO SURGEON STRL SZ8 (GLOVE) ×3 IMPLANT
GLOVE BIOGEL PI IND STRL 8 (GLOVE) ×2 IMPLANT
GLOVE BIOGEL PI IND STRL 8.5 (GLOVE) ×2 IMPLANT
GLOVE BIOGEL PI INDICATOR 8 (GLOVE) ×1
GLOVE BIOGEL PI INDICATOR 8.5 (GLOVE) ×1
GLOVE ECLIPSE 8.0 STRL XLNG CF (GLOVE) ×5 IMPLANT
GLOVE SS BIOGEL STRL SZ 7.5 (GLOVE) ×2 IMPLANT
GLOVE SUPERSENSE BIOGEL SZ 7.5 (GLOVE) ×1
GOWN STRL REUS W/ TWL LRG LVL3 (GOWN DISPOSABLE) ×1 IMPLANT
GOWN STRL REUS W/ TWL XL LVL3 (GOWN DISPOSABLE) ×4 IMPLANT
GOWN STRL REUS W/TWL 2XL LVL3 (GOWN DISPOSABLE) ×5 IMPLANT
GOWN STRL REUS W/TWL LRG LVL3 (GOWN DISPOSABLE) ×3
GOWN STRL REUS W/TWL XL LVL3 (GOWN DISPOSABLE) ×6
IMPL BASE TI 8X42X30MM-15 (Neuro Prosthesis/Implant) IMPLANT
IMPLANT BASE TI 8X42X30MM-15 (Neuro Prosthesis/Implant) ×3 IMPLANT
KIT BASIN OR (CUSTOM PROCEDURE TRAY) ×3 IMPLANT
KIT INFUSE XX SMALL 0.7CC (Orthopedic Implant) ×2 IMPLANT
KIT ROOM TURNOVER OR (KITS) ×3 IMPLANT
LOOP VESSEL MAXI BLUE (MISCELLANEOUS) ×2 IMPLANT
NDL SPNL 18GX3.5 QUINCKE PK (NEEDLE) ×1 IMPLANT
NEEDLE SPNL 18GX3.5 QUINCKE PK (NEEDLE) ×3 IMPLANT
NS IRRIG 1000ML POUR BTL (IV SOLUTION) ×3 IMPLANT
PACK LAMINECTOMY NEURO (CUSTOM PROCEDURE TRAY) ×3 IMPLANT
PUTTY BONE ATTRAX 1CC CYLINDER (Putty) ×4 IMPLANT
PUTTY BONE ATTRAX 5CC STRIP (Putty) ×3 IMPLANT
SPONGE LAP 18X18 X RAY DECT (DISPOSABLE) ×2 IMPLANT
SPONGE SURGIFOAM ABS GEL SZ50 (HEMOSTASIS) ×3 IMPLANT
STAPLER VISISTAT 35W (STAPLE) ×1 IMPLANT
SUT PDS AB 1 CTX 36 (SUTURE) ×3 IMPLANT
SUT VIC AB 2-0 CT1 36 (SUTURE) ×3 IMPLANT
SUT VIC AB 3-0 SH 27 (SUTURE) ×3
SUT VIC AB 3-0 SH 27X BRD (SUTURE) ×2 IMPLANT
TOWEL GREEN STERILE (TOWEL DISPOSABLE) ×3 IMPLANT
TOWEL GREEN STERILE FF (TOWEL DISPOSABLE) ×3 IMPLANT
TRAY FOLEY W/METER SILVER 16FR (SET/KITS/TRAYS/PACK) ×3 IMPLANT
WATER STERILE IRR 1000ML POUR (IV SOLUTION) ×3 IMPLANT

## 2016-08-15 NOTE — Transfer of Care (Signed)
Immediate Anesthesia Transfer of Care Note  Patient: [name]  Procedure(s) Performed: Procedure(s) with comments: Lumbar Five-Sacral One Anterior lumbar interbody fusion with Dr. Sherren Mocha Early to assist (N/A) - L5-S1 Anterior lumbar interbody fusion with Dr. Sherren Mocha Early to assist, possible pedicle screw fixation ABDOMINAL EXPOSURE (N/A)  Patient Location: PACU  Anesthesia Type:General  Level of Consciousness: awake, alert  and oriented  Airway & Oxygen Therapy: Patient Spontanous Breathing and Patient connected to nasal cannula oxygen  Post-op Assessment: Report given to RN, Post -op Vital signs reviewed and stable and Patient moving all extremities X 4  Post vital signs: Reviewed and stable  Last Vitals:  Vitals:   08/15/16 0548  BP: 127/73  Pulse: (!) 59  Resp: 20  Temp: 36.9 C    Last Pain:  Vitals:   08/15/16 0548  TempSrc: Oral         Complications: No apparent anesthesia complications

## 2016-08-15 NOTE — Progress Notes (Signed)
Awake, alert, conversant.  MAEW with full strength both lower extremities.  Doing well.

## 2016-08-15 NOTE — Brief Op Note (Signed)
08/15/2016  10:34 AM  PATIENT:  [name]  66 y.o. male  PRE-OPERATIVE DIAGNOSIS:  Spinal stenosis, Lumbar region with neurogenic claudication, spondylolisthesis, herniated lumbar disc, spondylosis, radiculopathy L 5 S 1 level   POST-OPERATIVE DIAGNOSIS:   Spinal stenosis, Lumbar region with neurogenic claudication, spondylolisthesis, herniated lumbar disc, spondylosis, radiculopathy L 5 S 1 level    PROCEDURE:  Procedure(s) with comments: Lumbar Five-Sacral One Anterior lumbar interbody fusion with Dr. Sherren Mocha Early to assist (N/A) - L5-S1 Anterior lumbar interbody fusion with Dr. Sherren Mocha Early to assist ABDOMINAL EXPOSURE (N/A)  SURGEON:  Surgeon(s) and Role: Panel 1:    Erline Levine, MD - Primary  Panel 2:    * Early, Arvilla Meres, MD - Primary  PHYSICIAN ASSISTANT:   ASSISTANTS: Poteat, RN   ANESTHESIA:   general  EBL:  Total I/O In: 1000 [I.V.:1000] Out: 325 [Urine:225; Blood:100]  BLOOD ADMINISTERED:none  DRAINS: none   LOCAL MEDICATIONS USED:  MARCAINE    and LIDOCAINE   SPECIMEN:  No Specimen  DISPOSITION OF SPECIMEN:  N/A  COUNTS:  YES  TOURNIQUET:  * No tourniquets in log *  DICTATION: INDICATIONS:  Pateint is 66 year old male with chronic and intractable back and left lower extremity pain with marked disc degeneration and spondylosis at the L 5 S 1 level.  He has previously undergone   It was elected to take him to surgery for anterior lumbar decompression and fusion at the L 5 S1 level.  PROCEDURE:  Doctor Early performed exposure and his portion of the procedure will be dictated separately.  Upon exposing the L 5 S1 level, a localizing X ray was obtained with the C arm.  I then incised the anterior annulus and performed a thorough discectomy.  The endplates were cleared of disc and cartilagenous material and a thorough discectomy was performed with decompression of the ventral annulus and disc material.  After trial, a 15 degree lordotic x 8 x 42 x 30 mm  Nuvasive Base titanium spacer was selected, packed with extra extra small BMP and Attrax bone graft extender.  The implant was tamped into position and positioning was confirmed with C arm.  Interfixated screws were placed in the   L 5 and S1 vertebrae using 22.5 x 5 mm at L 5  and 20 x 5 mm on the right and 6.0 x 20 mm screw on the left.  Locking mechanisms were engaged, soft tissues were inspected and found to be in good repair.  Retractors were removed.  Fascia was closed with 1 PDS vicryl running stitch, skin edges closed with 2-0 and 3-0 vicryl sutures.  Wound was dressed with Dermabond and a sterile occlusive dressing.  Patient was extubated in the OR and taken to recovery having tolerated his surgery well.  Counts were correct.   PLAN OF CARE: Admit to inpatient   PATIENT DISPOSITION:  PACU - hemodynamically stable.   Delay start of Pharmacological VTE agent (>24hrs) due to surgical blood loss or risk of bleeding: yes

## 2016-08-15 NOTE — Op Note (Signed)
08/15/2016  10:34 AM  PATIENT:  [name]  66 y.o. male  PRE-OPERATIVE DIAGNOSIS:  Spinal stenosis, Lumbar region with neurogenic claudication, spondylolisthesis, herniated lumbar disc, spondylosis, radiculopathy L 5 S 1 level   POST-OPERATIVE DIAGNOSIS:   Spinal stenosis, Lumbar region with neurogenic claudication, spondylolisthesis, herniated lumbar disc, spondylosis, radiculopathy L 5 S 1 level    PROCEDURE:  Procedure(s) with comments: Lumbar Five-Sacral One Anterior lumbar interbody fusion with Dr. Sherren Mocha Early to assist (N/A) - L5-S1 Anterior lumbar interbody fusion with Dr. Sherren Mocha Early to assist ABDOMINAL EXPOSURE (N/A)  SURGEON:  Surgeon(s) and Role: Panel 1:    Erline Levine, MD - Primary  Panel 2:    * Early, Arvilla Meres, MD - Primary  PHYSICIAN ASSISTANT:   ASSISTANTS: Poteat, RN   ANESTHESIA:   general  EBL:  Total I/O In: 1000 [I.V.:1000] Out: 325 [Urine:225; Blood:100]  BLOOD ADMINISTERED:none  DRAINS: none   LOCAL MEDICATIONS USED:  MARCAINE    and LIDOCAINE   SPECIMEN:  No Specimen  DISPOSITION OF SPECIMEN:  N/A  COUNTS:  YES  TOURNIQUET:  * No tourniquets in log *  DICTATION: INDICATIONS:  Pateint is 66 year old male with chronic and intractable back and left lower extremity pain with marked disc degeneration and spondylosis at the L 5 S 1 level.  He has previously undergone   It was elected to take him to surgery for anterior lumbar decompression and fusion at the L 5 S1 level.  PROCEDURE:  Doctor Early performed exposure and his portion of the procedure will be dictated separately.  Upon exposing the L 5 S1 level, a localizing X ray was obtained with the C arm.  I then incised the anterior annulus and performed a thorough discectomy.  The endplates were cleared of disc and cartilagenous material and a thorough discectomy was performed with decompression of the ventral annulus and disc material.  After trial, a 15 degree lordotic x 8 x 42 x 30 mm  Nuvasive Base titanium spacer was selected, packed with extra extra small BMP and Attrax bone graft extender.  The implant was tamped into position and positioning was confirmed with C arm.  Interfixated screws were placed in the   L 5 and S1 vertebrae using 22.5 x 5 mm at L 5  and 20 x 5 mm on the right and 6.0 x 20 mm screw on the left.  Locking mechanisms were engaged, soft tissues were inspected and found to be in good repair.  Retractors were removed.  Fascia was closed with 1 PDS vicryl running stitch, skin edges closed with 2-0 and 3-0 vicryl sutures.  Wound was dressed with Dermabond and a sterile occlusive dressing.  Patient was extubated in the OR and taken to recovery having tolerated his surgery well.  Counts were correct.   PLAN OF CARE: Admit to inpatient   PATIENT DISPOSITION:  PACU - hemodynamically stable.   Delay start of Pharmacological VTE agent (>24hrs) due to surgical blood loss or risk of bleeding: yes

## 2016-08-15 NOTE — Evaluation (Signed)
Physical Therapy Evaluation Patient Details Name: [name] MRN: [id number] DOB: 1950/05/31 Today's Date: 08/15/2016   History of Present Illness  Patient is a 66 y/o male presents s/p L5-S1 ALIF with abdominal exposure.  PMH includes HOH, SOB, HTN, DDD, neck surgery (ACDF 11/2014), bil carpal tunnel release.  Clinical Impression  Patient presents with pain and post surgical deficits s/p above surgery. Tolerated transfers and gait training with Min guard assist for balance/safety. Education re: back precautions, log roll technique, walking program etc. Pt has support from wife and sister at home as needed. This is pt;s 4th back surgery. Will plan for stair training and brace education tomorrow as tolerated. Will follow acutely to maximize independence and mobility prior to return home.     Follow Up Recommendations No PT follow up;Supervision - Intermittent    Equipment Recommendations  None recommended by PT    Recommendations for Other Services       Precautions / Restrictions Precautions Precautions: Fall;Back Precaution Booklet Issued: Yes (comment) Precaution Comments: Provided handout and reviewed precautions Required Braces or Orthoses: Spinal Brace Spinal Brace: Lumbar corset;Applied in sitting position Restrictions Weight Bearing Restrictions: No Other Position/Activity Restrictions: verbal orders per Dr. Vertell Limber, pt can mobilize in hospital today without brace. Wife to bring tomorrow.      Mobility  Bed Mobility Overal bed mobility: Needs Assistance Bed Mobility: Rolling;Sidelying to Sit;Sit to Sidelying Rolling: Modified independent (Device/Increase time) Sidelying to sit: Modified independent (Device/Increase time)     Sit to sidelying: Modified independent (Device/Increase time) General bed mobility comments: HOB flat, no use of rails to simulate home. Cues for log roll technique.  Transfers Overall transfer level: Needs assistance Equipment used:  None Transfers: Sit to/from Stand Sit to Stand: Min guard         General transfer comment: Min guard for safety.  Ambulation/Gait Ambulation/Gait assistance: Min guard Ambulation Distance (Feet): 120 Feet Assistive device: None Gait Pattern/deviations: Step-through pattern;Decreased stride length Gait velocity: decreased Gait velocity interpretation: Below normal speed for age/gender General Gait Details: Slow, mostly steady gait with mild instability but no buckling or LOB.   Stairs            Wheelchair Mobility    Modified Rankin (Stroke Patients Only)       Balance Overall balance assessment: Needs assistance Sitting-balance support: Feet supported;No upper extremity supported Sitting balance-Leahy Scale: Good     Standing balance support: During functional activity Standing balance-Leahy Scale: Good Standing balance comment: Able to wash hands at sink and reach outside BoS without difficulty.                             Pertinent Vitals/Pain Pain Assessment: 0-10 Pain Score: 7  Pain Location: back Pain Descriptors / Indicators: Sore;Operative site guarding Pain Intervention(s): Monitored during session;Repositioned;Premedicated before session    [place] expects to be discharged to:: Private residence Living Arrangements: Spouse/significant other Available Help at Discharge: Family;Available 24 hours/day (wife and sister; wife off til Tuesday) Type of Home: House Home Access: Stairs to enter Entrance Stairs-Rails: None Entrance Stairs-Number of Steps: 3 Home Layout: Two level;Bed/bath upstairs;Able to live on main level with bedroom/bathroom;Full bath on main level Home Equipment: Cane - single point;Walker - 2 wheels;Shower seat      Prior Function Level of Independence: Independent         Comments: Retired Engineer, structural; self employed doing Museum/gallery curator.     Hand Dominance  Dominant Hand:  Right    Extremity/Trunk Assessment   Upper Extremity Assessment Upper Extremity Assessment: Defer to OT evaluation    Lower Extremity Assessment Lower Extremity Assessment: Generalized weakness    Cervical / Trunk Assessment Cervical / Trunk Assessment: Other exceptions Cervical / Trunk Exceptions: s/p spine surgery  Communication   Communication: HOH  Cognition Arousal/Alertness: Awake/alert Behavior During Therapy: WFL for tasks assessed/performed Overall Cognitive Status: Within Functional Limits for tasks assessed                                        General Comments      Exercises     Assessment/Plan    PT Assessment Patient needs continued PT services  PT Problem List Decreased strength;Decreased mobility;Pain;Decreased balance;Decreased activity tolerance       PT Treatment Interventions Therapeutic activities;Gait training;Patient/family education;Therapeutic exercise;Balance training;Stair training;Functional mobility training    PT Goals (Current goals can be found in the Care Plan section)  Acute Rehab PT Goals Patient Stated Goal: to return to independence with less pain PT Goal Formulation: With patient Time For Goal Achievement: 08/29/16 Potential to Achieve Goals: Good    Frequency Min 5X/week   Barriers to discharge Inaccessible home environment stairs    Co-evaluation               AM-PAC PT "6 Clicks" Daily Activity  Outcome Measure Difficulty turning over in bed (including adjusting bedclothes, sheets and blankets)?: None Difficulty moving from lying on back to sitting on the side of the bed? : None Difficulty sitting down on and standing up from a chair with arms (e.g., wheelchair, bedside commode, etc,.)?: None Help needed moving to and from a bed to chair (including a wheelchair)?: None Help needed walking in hospital room?: A Little Help needed climbing 3-5 steps with a railing? : A Little 6 Click Score:  22    End of Session Equipment Utilized During Treatment: Gait belt Activity Tolerance: Patient tolerated treatment well Patient left: in bed;with call bell/phone within reach;with SCD's reapplied Nurse Communication: Mobility status PT Visit Diagnosis: Unsteadiness on feet (R26.81);Pain Pain - part of body:  (back)    Time: 4076-8088 PT Time Calculation (min) (ACUTE ONLY): 22 min   Charges:   PT Evaluation $PT Eval Low Complexity: 1 Procedure     PT G Codes:        Wray Kearns, PT, DPT [phone]    Marguarite Arbour A Arvilla Salada 08/15/2016, 4:07 PM

## 2016-08-15 NOTE — Interval H&P Note (Signed)
History and Physical Interval Note:  08/15/2016 7:20 AM  [name]  has presented today for surgery, with the diagnosis of Spinal stenosis, Lumbar region with neurogenic claudication  The various methods of treatment have been discussed with the patient and family. After consideration of risks, benefits and other options for treatment, the patient has consented to  Procedure(s) with comments: L5-S1 Anterior lumbar interbody fusion with Dr. Sherren Mocha Early to assist, possible pedicle screw fixation (N/A) - L5-S1 Anterior lumbar interbody fusion with Dr. Sherren Mocha Early to assist, possible pedicle screw fixation ABDOMINAL EXPOSURE (N/A) as a surgical intervention .  The patient's history has been reviewed, patient examined, no change in status, stable for surgery.  I have reviewed the patient's chart and labs.  Questions were answered to the patient's satisfaction.     Satvik Parco D

## 2016-08-15 NOTE — Anesthesia Procedure Notes (Signed)
Procedure Name: Intubation Date/Time: 08/15/2016 7:41 AM Performed by: Kyung Rudd Pre-anesthesia Checklist: Patient identified, Emergency Drugs available, Suction available and Patient being monitored Patient Re-evaluated:Patient Re-evaluated prior to inductionOxygen Delivery Method: Circle system utilized Preoxygenation: Pre-oxygenation with 100% oxygen Intubation Type: IV induction Ventilation: Mask ventilation without difficulty Laryngoscope Size: Mac and 4 Grade View: Grade II Tube type: Oral Tube size: 7.5 mm Number of attempts: 1 Airway Equipment and Method: Stylet Placement Confirmation: ETT inserted through vocal cords under direct vision,  positive ETCO2 and breath sounds checked- equal and bilateral Secured at: 22 cm Tube secured with: Tape Dental Injury: Teeth and Oropharynx as per pre-operative assessment

## 2016-08-15 NOTE — Op Note (Signed)
    OPERATIVE REPORT  DATE OF SURGERY: 08/15/2016  PATIENT: [name], 66 y.o. male MRN: [id number]  DOB: 12/27/1950  PRE-OPERATIVE DIAGNOSIS: Degenerative disc disease  POST-OPERATIVE DIAGNOSIS:  Same  PROCEDURE: Anterior exposure for L5-S1 disc surgery  SURGEON:  Curt Jews, M.D.  Co-surgeon for the exposure Dr. Dierdre Harness  PHYSICIAN ASSISTANT: Humberto Seals are in  ANESTHESIA:  Gen.  EBL: 100 ml  Total I/O In: 1500 [I.V.:1500] Out: 475 [Urine:375; Blood:100]  BLOOD ADMINISTERED: None  DRAINS: None  SPECIMEN: None  COUNTS CORRECT:  YES  PLAN OF CARE: PACU   PATIENT DISPOSITION:  PACU - hemodynamically stable  PROCEDURE DETAILS: The patient was taken to the operating placed supine position where the area of the abdomen is prepped and draped in usual sterile fashion. Crosstable lateral C-arm was used to show the level of the L5-S1 disc. This was marked on the skin. An incision was made from the midline to the left and was carried down through the subcutaneous fat with electrocautery. The anterior rectus sheath was opened in line with skin incision. Rectus muscle was mobilized circumferentially. The retroperitoneal space was entered and the left lower quadrant and the intraperitoneal contents were mobilized to the right. The left ureter was identified and it was mobilized to the right as well. Blunt dissection over the L5-S1 disc was continued to give mobile mobility of the iliac vessels. Middle sacral vessels were clipped and divided for exposure. The [name] retractor was brought onto the field. The reverse lip 150 blades were positioned to the right and left of the L5-S1 disc. Malleable retractors were used for anterior and superior exposure. The needle was positioned in the L5-S1 disc and C-arm again was brought back onto the field to confirm this was the correct level. The remainder of this procedure will be dictated as a separate note by Dr. Gari Crown, M.D., [facility] 08/15/2016 1:02 PM

## 2016-08-15 NOTE — Anesthesia Postprocedure Evaluation (Signed)
Anesthesia Post Note  Patient: [name]  Procedure(s) Performed: Procedure(s) (LRB): Lumbar Five-Sacral One Anterior lumbar interbody fusion with Dr. Sherren Mocha Early to assist (N/A) ABDOMINAL EXPOSURE (N/A)  Patient location during evaluation: PACU Anesthesia Type: General Level of consciousness: awake and alert Pain management: pain level controlled Vital Signs Assessment: post-procedure vital signs reviewed and stable Respiratory status: spontaneous breathing, nonlabored ventilation, respiratory function stable and patient connected to nasal cannula oxygen Cardiovascular status: blood pressure returned to baseline and stable Postop Assessment: no signs of nausea or vomiting Anesthetic complications: no       Last Vitals:  Vitals:   08/15/16 1230 08/15/16 1309  BP: 104/71 (!) 141/88  Pulse: 83 91  Resp: 15 16  Temp:  36.8 C    Last Pain:  Vitals:   08/15/16 1230  TempSrc:   PainSc: 6                  Tiajuana Amass

## 2016-08-16 MED ORDER — OXYCODONE-ACETAMINOPHEN 5-325 MG PO TABS: 1.0000 | ORAL_TABLET | ORAL | 0 refills | Status: DC | PRN

## 2016-08-16 MED ORDER — METHOCARBAMOL 750 MG PO TABS: 750.0000 mg | ORAL_TABLET | Freq: Four times a day (QID) | ORAL | 0 refills | Status: AC | PRN

## 2016-08-16 NOTE — Evaluation (Signed)
Occupational Therapy Evaluation/Discharge Patient Details Name: [name] MRN: [id number] DOB: 22-Feb-1951 Today's Date: 08/16/2016    History of Present Illness Patient is a 66 y/o male presents s/p L5-S1 ALIF with abdominal exposure.  PMH includes HOH, SOB, HTN, DDD, neck surgery (ACDF 11/2014), bil carpal tunnel release.   Clinical Impression   PTA, pt was independent with ADL and functional mobility. Pt currently requires min assist for LB ADL, min guard assist for shower transfers, and supervision for standing grooming tasks. He demonstrates good understanding of back precautions related to ADL and is able to verbalize and adhere to 3/3 throughout session. Education completed concerning compensatory strategies for dressing, bathing, toileting, and grooming tasks with pt/family demonstrating understanding. Pt has AE at home to assist with LB ADL and able to verbalize technique for use. Additionally educated pt on never wearing brace during sleep and bed mobility techniques. Pt will have 24 hour assistance initially post-acute D/C from wife who is able to safely provide all necessary assistance. No further acute OT needs identified. OT will sign off.     Follow Up Recommendations  No OT follow up;Supervision - Intermittent    Equipment Recommendations  None recommended by OT (has all needs met)    Recommendations for Other Services       Precautions / Restrictions Precautions Precautions: Fall;Back Precaution Booklet Issued: Yes (comment) Precaution Comments: Reviewed handout provided by PT and precautions during ADL. Able to recall 3/3 precautions.  Required Braces or Orthoses: Spinal Brace Spinal Brace: Lumbar corset;Applied in sitting position Restrictions Weight Bearing Restrictions: No      Mobility Bed Mobility Overal bed mobility: Modified Independent Bed Mobility: Rolling;Sidelying to Sit;Sit to Sidelying Rolling: Modified independent (Device/Increase  time) Sidelying to sit: Modified independent (Device/Increase time)     Sit to sidelying: Modified independent (Device/Increase time)    Transfers Overall transfer level: Needs assistance Equipment used: None Transfers: Sit to/from Stand Sit to Stand: Supervision         General transfer comment: Supervision for safety.     Balance Overall balance assessment: Needs assistance Sitting-balance support: Feet supported;No upper extremity supported Sitting balance-Leahy Scale: Good     Standing balance support: No upper extremity supported;During functional activity Standing balance-Leahy Scale: Good                             ADL either performed or assessed with clinical judgement   ADL Overall ADL's : Needs assistance/impaired Eating/Feeding: Set up;Sitting   Grooming: Supervision/safety;Standing   Upper Body Bathing: Supervision/ safety;Sitting   Lower Body Bathing: Minimal assistance;Sit to/from stand   Upper Body Dressing : Supervision/safety;Sitting Upper Body Dressing Details (indicate cue type and reason): VC's for donning brace properly Lower Body Dressing: Minimal assistance;Sit to/from stand Lower Body Dressing Details (indicate cue type and reason): Has AE at home and able to verbalize technique for use.  Toilet Transfer: Supervision/safety;Ambulation;BSC Toilet Transfer Details (indicate cue type and reason): BSC over toilet Toileting- Clothing Manipulation and Hygiene: Supervision/safety;Sit to/from stand   Tub/ Shower Transfer: Min guard;Ambulation;Shower seat;Tub transfer   Functional mobility during ADLs: Supervision/safety General ADL Comments: Educated pt concerning compensatory strategies for dressing, bathing, and toileting tasks and pt and family demonstrate good understanding. Educated pt concerning safe shower transfers and toilet transfers and he was able to complete without physical assistance (see above for details).       Vision Patient Visual Report: No change from baseline Vision Assessment?: No  apparent visual deficits     Perception     Praxis      Pertinent Vitals/Pain Pain Assessment: Faces Faces Pain Scale: Hurts little more Pain Location: back Pain Descriptors / Indicators: Sore;Operative site guarding Pain Intervention(s): Monitored during session;Repositioned     Hand Dominance Right   Extremity/Trunk Assessment Upper Extremity Assessment Upper Extremity Assessment: Overall WFL for tasks assessed   Lower Extremity Assessment Lower Extremity Assessment: Generalized weakness   Cervical / Trunk Assessment Cervical / Trunk Assessment: Other exceptions Cervical / Trunk Exceptions: s/p spine surgery   Communication Communication Communication: HOH   Cognition Arousal/Alertness: Awake/alert Behavior During Therapy: WFL for tasks assessed/performed Overall Cognitive Status: Within Functional Limits for tasks assessed                                     General Comments       Exercises     Shoulder Instructions      Home Living Family/patient expects to be discharged to:: Private residence Living Arrangements: Spouse/significant other Available Help at Discharge: Family;Available 24 hours/day (wife and sister; wife until Tuesday) Type of Home: House Home Access: Stairs to enter [employer] of Steps: 3 Entrance Stairs-Rails: None Home Layout: Two level;Bed/bath upstairs;Able to live on main level with bedroom/bathroom;Full bath on main level Alternate Level Stairs-Number of Steps: flight   Bathroom Shower/Tub: Walk-in shower;Tub/shower unit   Bathroom Toilet: Standard     Home Equipment: Cane - single point;Walker - 2 wheels;Bedside commode;Adaptive equipment Adaptive Equipment: Reacher;Sock aid        Prior Functioning/Environment Level of Independence: Independent        Comments: Retired Engineer, structural; self employed doing  Museum/gallery curator.        OT Problem List: Decreased strength;Decreased range of motion;Decreased activity tolerance;Impaired balance (sitting and/or standing);Decreased safety awareness;Decreased knowledge of use of DME or AE;Decreased knowledge of precautions;Pain      OT Treatment/Interventions:      OT Goals(Current goals can be found in the care plan section) Acute Rehab OT Goals Patient Stated Goal: to return to independence with less pain OT Goal Formulation: With patient/family Time For Goal Achievement: 08/30/16 Potential to Achieve Goals: Good  OT Frequency:     Barriers to D/C:            Co-evaluation              AM-PAC PT "6 Clicks" Daily Activity     Outcome Measure Help from another person eating meals?: None Help from another person taking care of personal grooming?: A Little Help from another person toileting, which includes using toliet, bedpan, or urinal?: A Little Help from another person bathing (including washing, rinsing, drying)?: A Little Help from another person to put on and taking off regular upper body clothing?: A Little Help from another person to put on and taking off regular lower body clothing?: A Little 6 Click Score: 19   End of Session Equipment Utilized During Treatment: Back brace Nurse Communication: Mobility status  Activity Tolerance: Patient tolerated treatment well Patient left: in chair;with call bell/phone within reach;with family/visitor present  OT Visit Diagnosis: Other abnormalities of gait and mobility (R26.89);Pain Pain - Right/Left:  (back) Pain - part of body:  (back)                Time: 7858-8502 OT Time Calculation (min): 14 min Charges:  OT General Charges $OT  Visit: 1 Procedure OT Evaluation $OT Eval Low Complexity: 1 Procedure G-Codes:     Norman Herrlich, MS OTR/L  Pager: [place] A Jacalynn Buzzell 08/16/2016, 9:09 AM

## 2016-08-16 NOTE — Discharge Instructions (Signed)

## 2016-08-16 NOTE — Progress Notes (Signed)
Physical Therapy Treatment Patient Details Name: [name] MRN: [id number] DOB: Sep 09, 1950 Today's Date: 08/16/2016    History of Present Illness Patient is a 66 y/o male presents s/p L5-S1 ALIF with abdominal exposure.  PMH includes HOH, SOB, HTN, DDD, neck surgery (ACDF 11/2014), bil carpal tunnel release.    PT Comments    Pt presents with improved mobility this session. Pt is able to recall 3/3 back precautions with minimal cueing. Pt recall how to appropriately get into and out of bed. Able to negotiate stairs and perform gait with supervision bordering on Mod I.     Follow Up Recommendations  No PT follow up;Supervision - Intermittent     Equipment Recommendations  None recommended by PT    Recommendations for Other Services       Precautions / Restrictions Precautions Precautions: Fall;Back Precaution Booklet Issued: Yes (comment) Precaution Comments: Reviewed handout provided by PT and precautions during ADL. Able to recall 3/3 precautions.  Required Braces or Orthoses: Spinal Brace Spinal Brace: Lumbar corset;Applied in sitting position Restrictions Weight Bearing Restrictions: No    Mobility  Bed Mobility Overal bed mobility: Modified Independent Bed Mobility: Rolling;Sidelying to Sit;Sit to Sidelying Rolling: Modified independent (Device/Increase time) Sidelying to sit: Modified independent (Device/Increase time)     Sit to sidelying: Modified independent (Device/Increase time) General bed mobility comments: sitting in recliner when PT arrives  Transfers Overall transfer level: Needs assistance Equipment used: None Transfers: Sit to/from Stand Sit to Stand: Modified independent (Device/Increase time)         General transfer comment: Mod I from recliner.   Ambulation/Gait Ambulation/Gait assistance: Min guard;Supervision Ambulation Distance (Feet): 500 Feet Assistive device: None Gait Pattern/deviations: Step-through pattern;Decreased  stride length Gait velocity: decreased Gait velocity interpretation: Below normal speed for age/gender General Gait Details: Slow, mostly steady gait with mild instability but no buckling or LOB.    Stairs Stairs: Yes   Stair Management: One rail Right;Step to pattern;Sideways Number of Stairs: 11 General stair comments: good sequencing and no cues for proper performance  Wheelchair Mobility    Modified Rankin (Stroke Patients Only)       Balance Overall balance assessment: Needs assistance Sitting-balance support: No upper extremity supported;Feet supported Sitting balance-Leahy Scale: Good     Standing balance support: No upper extremity supported Standing balance-Leahy Scale: Good                              Cognition Arousal/Alertness: Awake/alert Behavior During Therapy: WFL for tasks assessed/performed Overall Cognitive Status: Within Functional Limits for tasks assessed                                        Exercises      General Comments        Pertinent Vitals/Pain Pain Assessment: 0-10 Pain Score: 5  Faces Pain Scale: Hurts little more Pain Location: back with activity Pain Descriptors / Indicators: Sore;Operative site guarding Pain Intervention(s): Monitored during session;Premedicated before session;Ice applied    Home Living Family/patient expects to be discharged to:: Private residence Living Arrangements: Spouse/significant other Available Help at Discharge: Family;Available 24 hours/day (wife and sister; wife until Tuesday) Type of Home: House Home Access: Stairs to enter Entrance Stairs-Rails: None Home Layout: Two level;Bed/bath upstairs;Able to live on main level with bedroom/bathroom;Full bath on main level Home Equipment: Cane - single  point;Walker - 2 wheels;Bedside commode;Adaptive equipment      Prior Function Level of Independence: Independent      Comments: Retired Engineer, structural; self employed  doing Museum/gallery curator.   PT Goals (current goals can now be found in the care plan section) Acute Rehab PT Goals Patient Stated Goal: to return to independence with less pain Progress towards PT goals: Progressing toward goals    Frequency    Min 5X/week      PT Plan Current plan remains appropriate    Co-evaluation              AM-PAC PT "6 Clicks" Daily Activity  Outcome Measure  Difficulty turning over in bed (including adjusting bedclothes, sheets and blankets)?: None Difficulty moving from lying on back to sitting on the side of the bed? : None Difficulty sitting down on and standing up from a chair with arms (e.g., wheelchair, bedside commode, etc,.)?: None Help needed moving to and from a bed to chair (including a wheelchair)?: None Help needed walking in hospital room?: None Help needed climbing 3-5 steps with a railing? : A Little 6 Click Score: 23    End of Session Equipment Utilized During Treatment: Gait belt Activity Tolerance: Patient tolerated treatment well Patient left: in chair;with call bell/phone within reach;with family/visitor present Nurse Communication: Mobility status PT Visit Diagnosis: Unsteadiness on feet (R26.81);Pain Pain - part of body:  (back)     Time: 3846-6599 PT Time Calculation (min) (ACUTE ONLY): 10 min  Charges:  $Gait Training: 8-22 mins                    G Codes:       Scheryl Marten PT, DPT  [phone]    Jacqulyn Liner Sloan Leiter 08/16/2016, 11:48 AM

## 2016-08-16 NOTE — Discharge Summary (Signed)
Physician Discharge Summary  Patient ID: [name] MRN: [id number] DOB/AGE: [birth year] 66 y.o.  Admit date: 08/15/2016 Discharge date: 08/16/2016  Admission Diagnoses:  Spinal stenosis, Lumbar region with neurogenic claudication, spondylolisthesis, herniated lumbar disc, spondylosis, radiculopathy L 5 S 1 level   Discharge Diagnoses:  Spinal stenosis, Lumbar region with neurogenic claudication, spondylolisthesis, herniated lumbar disc, spondylosis, radiculopathy L 5 S 1 level  Active Problems:   Spondylolisthesis of lumbar region   Discharged Condition: good  Hospital Course:  Patient admitted by Dr. Vertell Limber who performed a L5 S1 ALIF. Postoperatively the patient has done well. He is up and ambulating actively. He is voiding well. He is taking po well. His dressing is clean and dry. He is asking to be discharged to home. He has been given instructions regarding wound care and activities following discharge. He is to contact Dr. Melven Sartorius office next week to set up a follow-up appointment.  Discharge Exam: Blood pressure 105/69, pulse 63, temperature 98.9 F (37.2 C), resp. rate 18, SpO2 96 %.  Disposition: 01-Home or Self Care  Discharge Instructions    Diet - low sodium heart healthy    Complete by:  As directed    Increase activity slowly    Complete by:  As directed      Allergies as of 08/16/2016   No Known Allergies     Medication List    TAKE these medications   acetaminophen 325 MG tablet Commonly known as:  TYLENOL Take 325 mg by mouth every 6 (six) hours as needed for mild pain.   albuterol 108 (90 Base) MCG/ACT inhaler Commonly known as:  PROVENTIL HFA;VENTOLIN HFA Inhale 2 puffs into the lungs every 6 (six) hours as needed for wheezing or shortness of breath (cough).   ALIGN 4 MG Caps Take 4 mg by mouth daily.   atorvastatin 20 MG tablet Commonly known as:  LIPITOR Take 20 mg by mouth every evening.   celecoxib 200 MG capsule Commonly known as:   CELEBREX Take 200 mg by mouth daily.   diclofenac 75 MG EC tablet Commonly known as:  VOLTAREN Take 75 mg by mouth daily as needed for mild pain.   esomeprazole 20 MG capsule Commonly known as:  NEXIUM Take 40 mg by mouth daily.   fluticasone 50 MCG/ACT nasal spray Commonly known as:  FLONASE Place 2 sprays into both nostrils daily as needed for allergies or rhinitis (congestion).   gabapentin 400 MG capsule Commonly known as:  NEURONTIN Take 800 mg by mouth 2 (two) times daily.   HYDROmorphone 2 MG tablet Commonly known as:  DILAUDID Take 1-2 tablets (2-4 mg total) by mouth every 4 (four) hours as needed for moderate pain.   lisinopril-hydrochlorothiazide 10-12.5 MG tablet Commonly known as:  PRINZIDE,ZESTORETIC Take 1 tablet by mouth daily.   loratadine 10 MG tablet Commonly known as:  CLARITIN Take 10 mg by mouth daily.   methocarbamol 500 MG tablet Commonly known as:  ROBAXIN Take 1 tablet (500 mg total) by mouth every 6 (six) hours as needed for muscle spasms. What changed:  Another medication with the same name was added. Make sure you understand how and when to take each.   methocarbamol 750 MG tablet Commonly known as:  ROBAXIN Take 1 tablet (750 mg total) by mouth every 6 (six) hours as needed for muscle spasms. What changed:  You were already taking a medication with the same name, and this prescription was added. Make sure you understand how and when  to take each.   Oxycodone HCl 10 MG Tabs TAKE 1 TABLET BY MOUTH AS NEEDED 3 TIMES A DAY PER DAY FOR PAIN   oxyCODONE-acetaminophen 5-325 MG tablet Commonly known as:  PERCOCET/ROXICET Take 1-2 tablets by mouth every 4 (four) hours as needed (pain).   pramipexole 0.125 MG tablet Commonly known as:  MIRAPEX Take 1 tablet (0.125 mg total) by mouth 3 (three) times daily.   ranitidine 150 MG tablet Commonly known as:  ZANTAC Take 150 mg by mouth at bedtime.   tamsulosin 0.4 MG Caps capsule Commonly known as:   FLOMAX Take 0.4 mg by mouth daily with breakfast.   tiZANidine 2 MG tablet Commonly known as:  ZANAFLEX Take 2 mg by mouth 2 (two) times daily.   traZODone 150 MG tablet Commonly known as:  DESYREL Take 150 mg by mouth at bedtime. For sleep   zolpidem 10 MG tablet Commonly known as:  AMBIEN Take 10 mg by mouth at bedtime.        SignedHosie Spangle 08/16/2016, 9:10 AM

## 2016-08-16 NOTE — Progress Notes (Signed)
Patient alert and oriented, mae's well, voiding adequate amount of urine, swallowing without difficulty, c/o mild pain and meds given prior to discharged for ride and discomfort. Patient discharged home with family. Script and discharged instructions given to patient. Patient and family stated understanding of instructions given.

## 2016-08-16 NOTE — Care Management Note (Signed)
Case Management Note  Patient Details  Name: TIMBER LUCARELLI MRN: [id number] Date of Birth: 02-01-1951  Subjective/Objective:                  Spinal stenosis Action/Plan: Discharge planning Expected Discharge Date:  08/16/16               Expected Discharge Plan:  Home/Self Care  In-House Referral:     Discharge planning Services  CM Consult  Post Acute Care Choice:  NA Choice offered to:  NA  DME Arranged:  N/A DME Agency:  NA  HH Arranged:  NA HH Agency:  NA  Status of Service:  Completed, signed off  If discussed at [place] of Stay Meetings, dates discussed:    Additional Comments: CM spoke with spouse of pt who states pt has all equipment needed at home and is not having any home health services.  No other CM needs were communicated. Dellie Catholic, RN 08/16/2016, 9:20 AM

## 2016-08-20 ENCOUNTER — Encounter (HOSPITAL_COMMUNITY): Payer: Self-pay | Admitting: Neurosurgery

## 2016-08-22 ENCOUNTER — Ambulatory Visit: Payer: 59 | Admitting: Neurology

## 2016-11-28 ENCOUNTER — Encounter: Payer: Self-pay | Admitting: Neurology

## 2016-11-28 ENCOUNTER — Ambulatory Visit (INDEPENDENT_AMBULATORY_CARE_PROVIDER_SITE_OTHER): Payer: 59 | Admitting: Neurology

## 2016-11-28 VITALS — BP 100/64 | HR 73 | Ht 71.0 in | Wt 233.5 lb

## 2016-11-28 DIAGNOSIS — R269 Unspecified abnormalities of gait and mobility: Secondary | ICD-10-CM | POA: Diagnosis not present

## 2016-11-28 DIAGNOSIS — G250 Essential tremor: Secondary | ICD-10-CM | POA: Diagnosis not present

## 2016-11-28 MED ORDER — PRIMIDONE 50 MG PO TABS: 25.0000 mg | ORAL_TABLET | Freq: Two times a day (BID) | ORAL | 5 refills | Status: DC

## 2016-11-28 NOTE — Progress Notes (Signed)
Follow-up Visit   Date: 11/28/16    [name] MRN: [id number] DOB: 06-25-1950   Interim History: [name] is a 66 y.o. right-handed Caucasian male with history of GERD, hypertension, hyperlipidemia, CTS s/p bilateral release, s/p cervical C4-5, C5-6, C6-7 decompression and fusion (2016), L3-4, L4-5 lumbar decompression and fusions returning to the clinic for evaluation of worsening tremors.  He is a retired Therapist, art.   History of present illness: Around 2007, he developed gradual onset of bilateral numbness and tingling of the fingers, initially attributed to his neck so did not seek any medication attention. In 2012, he went to his PCP because of worsening problems with fine motor movements (utilizing bolts, tools) and dropping things. He had EMG which showed bilateral CTS and underwent right CTS release by Dr. [name] in 2014, with improvement of nighttime shooting pain, but no change in motor symptoms. He saw Dr. [name] in the fall of 2014 for second opinion and underwent left CTS release on 05/17/2013. Again, his tingling and shooting nocturnal symptoms improved, but he continues to have problems with dexterity and finger coordination.   Overall, there has been no worsening of paresthesias or weakness, but he is concerned because his fine motor movements did not improve following surgery. He works as a Education officer, museum frequently, which notes greater difficulty manipulating. Current symptoms include: constant numbness >> tingling of the hands and when he tries to use the hands, there is throbbing pain in the forearm and neck. He has associated weakness of the hands and finds himself dropping things such as keys, using screwdrivers, wrenches, etc.   He has long history of low back pain and does endorse numbness of the feet, which predated his upper extremity symptoms. He has previously been seen by Dr. [name] who did  EMG in late 1990s which showed nerve damage, left > right leg. He is taking gabapentin '800mg'$  BID for his feet, which helps.  In 2015, he had repeat NCS/EMG which showed no evidence of neuropathy or CTS on the left side, only mild L3-4 radiculopathy.  Neuropathy labs also returned normal.   UPDATE 04/23/2015:   Since he was last seen here, he underwent cervical C4-5, C5-6, C6-7 decompression and fusion in September 2016.  He is also s/p right L3-4, L4-5 fusion in October 2017 for spinal stenosos with herniated disc.  Today, complains of worsening hand and head tremor.  His tremor is functionally interfering with his fine motor skills, such has using tools.  He has returned to work in Museum/gallery curator and the movements can make it difficult for him to do his work.  Tremors are predominately present with he is working, but also at rest.  His head tremor is most noticeable at rest.  There is no change with alcohol or caffeine.  He has two family members with parkinson's disease, none with essential tremor.    UPDATE 11/28/2016:  He is here for follow-up of hand tremors.  He tried mirapex 0.'125mg'$  BID and did not appreciate any benefit, so stopped it.  He also developed nausea with this.  His tremors are always bothersome when he is trying to do activities with his hands, such as using his tools.  During the summer, he underwent another back surgery with Dr. Vertell Limber and is also seeing Dr. Maryjean Ka and considering a pain stimulator.  He will be moving into a smaller condominium in the next few months.  Medications:  Current Outpatient Prescriptions on File Prior to Visit  Medication Sig Dispense Refill  . acetaminophen (TYLENOL) 325 MG tablet Take 325 mg by mouth every 6 (six) hours as needed for mild pain.    Marland Kitchen albuterol (PROVENTIL HFA;VENTOLIN HFA) 108 (90 BASE) MCG/ACT inhaler Inhale 2 puffs into the lungs every 6 (six) hours as needed for wheezing or shortness of breath (cough).     Marland Kitchen atorvastatin  (LIPITOR) 20 MG tablet Take 20 mg by mouth every evening.     . celecoxib (CELEBREX) 200 MG capsule Take 200 mg by mouth daily.    . diclofenac (VOLTAREN) 75 MG EC tablet Take 75 mg by mouth daily as needed for mild pain.     Marland Kitchen esomeprazole (NEXIUM) 20 MG capsule Take 40 mg by mouth daily.  0  . fluticasone (FLONASE) 50 MCG/ACT nasal spray Place 2 sprays into both nostrils daily as needed for allergies or rhinitis (congestion).     . gabapentin (NEURONTIN) 400 MG capsule Take 800 mg by mouth 2 (two) times daily.    Marland Kitchen lisinopril-hydrochlorothiazide (PRINZIDE,ZESTORETIC) 10-12.5 MG per tablet Take 1 tablet by mouth daily.    Marland Kitchen loratadine (CLARITIN) 10 MG tablet Take 10 mg by mouth daily.     . methocarbamol (ROBAXIN) 750 MG tablet Take 1 tablet (750 mg total) by mouth every 6 (six) hours as needed for muscle spasms. 50 tablet 0  . Oxycodone HCl 10 MG TABS TAKE 1 TABLET BY MOUTH AS NEEDED 3 TIMES A DAY PER DAY FOR PAIN  0  . Probiotic Product (ALIGN) 4 MG CAPS Take 4 mg by mouth daily.     . ranitidine (ZANTAC) 150 MG tablet Take 150 mg by mouth at bedtime.    . tamsulosin (FLOMAX) 0.4 MG CAPS capsule Take 0.4 mg by mouth daily with breakfast.  6  . tiZANidine (ZANAFLEX) 2 MG tablet Take 2 mg by mouth 2 (two) times daily.    . traZODone (DESYREL) 150 MG tablet Take 150 mg by mouth at bedtime. For sleep    . zolpidem (AMBIEN) 10 MG tablet Take 10 mg by mouth at bedtime.     No current facility-administered medications on file prior to visit.     Allergies: No Known Allergies   Review of Systems:  CONSTITUTIONAL: No fevers, chills, night sweats, or weight loss.  EYES: No visual changes or eye pain ENT: No hearing changes.  No history of nose bleeds.   RESPIRATORY: No cough, wheezing and shortness of breath.   CARDIOVASCULAR: Negative for chest pain, and palpitations.   GI: Negative for abdominal discomfort, blood in stools or black stools.  No recent change in bowel habits.   GU:  No history  of incontinence.   MUSCLOSKELETAL: +history of joint pain or swelling.  No myalgias.   SKIN: Negative for lesions, rash, and itching.   ENDOCRINE: Negative for cold or heat intolerance, polydipsia or goiter.   PSYCH:  No depression or anxiety symptoms.   NEURO: As Above.   Vital Signs:  BP 100/64   Pulse 73   Ht '5\' 11"'$  (1.803 m)   Wt 233 lb 8 oz (105.9 kg)   SpO2 96%   BMI 32.57 kg/m   General:  Well appearing, comfortable  Neurological Exam: MENTAL STATUS including orientation to time, place, person, recent and remote memory, attention span and concentration, language, and fund of knowledge is normal.  Speech is not dysarthric.  Mildly blunted affect.  CRANIAL NERVES:  Pupils  equal round and reactive to light.  Normal conjugate, extra-ocular eye movements in all directions of gaze.  No ptosis.  Face is symmetric.   MOTOR:  Motor strength is 5/5 in all extremities.  There is intermittent resting hand tremor, especially with eyes closed.  He also has intention tremor of the hands, absent tremor at rest.   Tone is normal.    MSRs:  Reflexes are 2+/4 throughout, except 1+ bilateral Achilles  COORDINATION/GAIT:   Intact rapid alternating movements bilaterally.  Gait appears antalgic with mild dragging of the left leg.  Data: Labs 11/01/2013:  GTT 103/133/99, ESR 5, TSH 1.99, B12 318, vitamin B1 16, copper 103, ceruloplasmin 29, SPEP/UPEP with IFE no M protein  MRI cervical spine 23-May-2012:  Pronounced spondylosis at C4-5 and C5-6. There is foraminal stenosis of both levels bilaterally. Either or both C5 and/or C6 nerve roots could be compressed. The canal is narrowed at both levels, more so at C5-6, where there is slight indentation of the ventral cord.   EMG dated 11/29/2012 performed at [facility], [place]:  Bilateral CTS, mild-moderate in degree.  Bilateral median sensory and motor latencies are prolonged, worse on the right (sensory R 2.6 L 2.53m, motor:  R 4.428m L 4.53m78m with reduced sensory amplitudes (R 16.7, 19.0) and preserved motor amplitudes.  [facility]: 1. Chronic L3-L4 radiculopathy affecting the left side, mild in degree electrically. 2. There is no evidence of a generalized sensorimotor polyneuropathy, carpal tunnel syndrome, or cervical radiculopathy affecting the left side.  MRI lumbar spine 02/10/2015: 1. Moderate size left extraforaminal disc extrusion at L4-5 with resulting left L4 nerve root encroachment. 2. 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 There is resulting mild multifactorial spinal stenosis at L3-4 and L4-5. 3. Chronic degenerative disc disease at L5-S1 with endplate osteophytes and facet hypertrophy contributing to chronic osseous foraminal narrowing bilaterally.  MRI brain wo contrast 05/16/2016: 1. No acute intracranial abnormality. 2. Mild cerebral white matter T2 signal changes, nonspecific though most often seen with chronic small vessel ischemia.  IMPRESSION.PLAN: 1.   Benign essential tremor  - Initially symptoms concerning for parkinsonian tremor and offered a trial of pramipexole 0.125 TID, but he developed nasue and no change in tremor so this was discontinued  - Start primidone '25mg'$  twice daily  - Discontinue mirapex due to no benefit and side effects (nausea)  2.  S/p cervical and lumbar decompression and fusion s/p anterior lumbar interbody fusion L5-S1 in May by Dr. SteVertell Limberd followed by Dr. HarMaryjean Kar pain management  3.  Gait abnormality with mild dragging of the left leg following his recent surgery.  Recommend that he f/u with his neurosurgery team for evaluation.  I do not appreciate any frank weakness or UMN findings on his exam.   Return to clinic in 4 months   Greater than 50% of this 25 minute visit was spent in counseling, explanation of diagnosis, planning of further management, and coordination of  care.   Thank you for allowing me to participate in patient's care.  If I can answer any additional questions, I would be pleased to do so.    Sincerely,    Librada Castronovo K. PatPosey ProntoO

## 2016-11-28 NOTE — Patient Instructions (Signed)
Start primidone 25mg  at bedtime for one week and then increase to half-tablet twice daily   Return to clinic 4-6 months

## 2017-03-23 ENCOUNTER — Encounter: Payer: 59 | Attending: Psychology | Admitting: Psychology

## 2017-03-23 DIAGNOSIS — E785 Hyperlipidemia, unspecified: Secondary | ICD-10-CM | POA: Diagnosis not present

## 2017-03-23 DIAGNOSIS — M549 Dorsalgia, unspecified: Secondary | ICD-10-CM | POA: Insufficient documentation

## 2017-03-23 DIAGNOSIS — Z87442 Personal history of urinary calculi: Secondary | ICD-10-CM | POA: Diagnosis not present

## 2017-03-23 DIAGNOSIS — G894 Chronic pain syndrome: Secondary | ICD-10-CM | POA: Insufficient documentation

## 2017-03-23 DIAGNOSIS — I10 Essential (primary) hypertension: Secondary | ICD-10-CM | POA: Insufficient documentation

## 2017-03-23 DIAGNOSIS — Z87891 Personal history of nicotine dependence: Secondary | ICD-10-CM | POA: Diagnosis not present

## 2017-03-23 DIAGNOSIS — G4700 Insomnia, unspecified: Secondary | ICD-10-CM | POA: Diagnosis not present

## 2017-04-12 ENCOUNTER — Encounter: Payer: Self-pay | Admitting: Psychology

## 2017-04-12 NOTE — Progress Notes (Signed)
Neuropsychological Consultation   Patient:   [name]   DOB:   Nov 01, 1950  MR Number:  [id number]  Location:  [facility] [address], [place] [id number] [place] [address] Dept: [phone]           Date of Service:   03/23/2017  Start Time:   4 PM End Time:   5 PM  Provider/Observer:  Ilean Skill, Psy.D.       Clinical Neuropsychologist       Billing Code/Service: Psychological clinical interview  Chief Complaint:    [name] was referred by Dr. Maryjean Ka for psychological evaluation as part of the standard protocols for assessment for appropriateness for possible spinal cord stimulator trialing and implantation.  The patient describes dealing with significant chronic pain for the past 35 years.  The patient has had neurosurgical interventions in the past.  The patient reports that his pain has been much more severe recently.  The patient denies any history of depression or anxiety but does acknowledge ongoing sleep disturbance.  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  He reports that he will wake up in significant pain.  The patient does acknowledge that he has been diagnosed with Parkinson's-like symptoms in the past but the neurologist current feelings are that they are related to benign essential tremors.  Reason for Service:  The patient was referred for psychological evaluation by Dr. Maryjean Ka as part of the preliminary workup for consideration for spinal cord stimulator trialing and possible implantation.  Current Status:  The patient describes significant pain in his left leg, back and feet.  Reliability of Information: The information is provided through a 1 hour face-to-face clinical interview as well as review of available medical records.  Behavioral  Observation: [name]  presents as a 67 y.o.-year-old Right  Male who appeared his stated age. his dress was Appropriate and he was Well Groomed and his manners were Appropriate to the situation.  his participation was indicative of Appropriate and Attentive behaviors.  There were any physical disabilities noted.  he displayed an appropriate level of cooperation and motivation.     Interactions:    Active Appropriate  Attention:   within normal limits and attention span and concentration were age appropriate  Memory:   within normal limits; recent and remote memory intact  Visuo-spatial:  within normal limits  Speech (Volume):  normal  Speech:   normal;   Thought Process:  Coherent and Relevant  Though Content:  WNL; not suicidal  Orientation:   person, place, time/date and situation  Judgment:   Good  Planning:   Good  Affect:    Appropriate  Mood:    NA  Insight:   Good  Intelligence:   normal  Marital Status/Living: The patient was born in [place] grew up in [place].  The patient is married and currently lives with his wife.  His parents are deceased.  Current Employment: The patient is retired.  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  Substance Use:  No concerns of substance abuse are reported.    Education:   The patient completed 1 year of college and had many classes/in-service educational experiences in law  enforcement courses.  Medical History:   Past Medical History:  Diagnosis Date  . Abdominal pain   . Allergic rhinitis, cause unspecified   . Asthma   . Benign essential hypertension    Low salt diet. Systolic goal <938, diastolic goal <10.  Marland Kitchen Blood in stool   . Cancer ([place])    basal cell removed several places   . Carpal tunnel syndrome, bilateral   . Cervicalgia   . Chest discomfort     Could be related to hiatal hernia but because of Hx of HTN, HLD and + FHX, an exercise stress test & referral to cardiology will be arranged.  . Chronic back pain    On celebrex  . Chronic insomnia   . Chronic neck pain    On celebrex  . Complication of anesthesia    woke up slowly after deviated septum surg. Frances Maywood.   . Degenerative disk disease    degenerative in cervical area & lumbar area  . Diarrhea   . Diverticulitis   . Diverticulosis   . Dizziness    occational  . DOE (dyspnea on exertion)    occasional. Now can also feel when he is at rest. Former smoker.  Marland Kitchen Dyspepsia   . Dyspnea   . Elevated blood pressure 03/02/13   (x) 2 years. Referral to cardiology  . 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 [name]  . Esophageal reflux   . 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 Gastric ulcer   . Hiatal hernia   . History of kidney stones    passed - 5 times  . HOH (hard of hearing)   . Hyperlipidemia    Last FLP in 2012 slightly elevated. LDL goal <100.  . Midsternal chest pain    Has had for a while, which he contributed to hiatal hernia and peptic disease. No radiation, not sure about duration, not associated with diaphoresis.  . Pain in joint, site unspecified   . Peptic disease   . Seasonal allergies   . Sleep apnea syndrome    was to try cpap-did not get one-repeat studies could not sleep  . Sleep disturbance, unspecified   . SOB (shortness of breath)   . Tremor    Per Dr. [name]  . Ventral hernia    never corrected        Abuse/Trauma History: The patient denies any history of significant traumatic experiences or abuse history.  Psychiatric History:  The patient denies any prior psychiatric history.  Family Med/Psych History:  Family History  Problem Relation Age of Onset  . Heart attack Father        Deceased, 68  . Alzheimer's disease Mother        Deceased, 6   . CAD Unknown   . CAD Brother   . Hypertension Brother   . Hyperlipidemia Brother   . Hyperlipidemia Sister   . Hypertension Sister     Risk of Suicide/Violence: virtually non-existent the patient denies any suicidal or homicidal ideation.  Impression/DX:  [name] was referred by Dr. Maryjean Ka for psychological evaluation as part of the standard protocols for assessment for appropriateness for possible spinal cord stimulator trialing and implantation.  The patient describes dealing with significant chronic pain for the past 35 years.  The patient has had neurosurgical interventions in the past.  The patient reports that his pain  has been much more severe recently.  The patient denies any history of depression or anxiety but does acknowledge ongoing sleep disturbance.  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  He reports that he will wake up in significant pain.  The patient does acknowledge that he has been diagnosed with Parkinson's-like symptoms in the past but the neurologist current feelings are that they are related to benign essential tremors.  The patient also denies any significant issues related to depression or anxiety.  The patient denies any issues related to significant.  Disposition/Plan:  The patient will complete the [place] multiphasic personality inventory-2 as well as the pain patient profile.  Once that is ablated a formal report will be produced and provided to Dr. Maryjean Ka.  Diagnosis:    Chronic pain syndrome         Electronically Signed   _______________________ Ilean Skill, Psy.D.

## 2017-04-30 ENCOUNTER — Encounter: Payer: 59 | Attending: Psychology | Admitting: Psychology

## 2017-04-30 ENCOUNTER — Encounter: Payer: Self-pay | Admitting: Psychology

## 2017-04-30 DIAGNOSIS — G894 Chronic pain syndrome: Secondary | ICD-10-CM

## 2017-04-30 DIAGNOSIS — Z87891 Personal history of nicotine dependence: Secondary | ICD-10-CM | POA: Insufficient documentation

## 2017-04-30 DIAGNOSIS — Z87442 Personal history of urinary calculi: Secondary | ICD-10-CM | POA: Diagnosis not present

## 2017-04-30 DIAGNOSIS — I10 Essential (primary) hypertension: Secondary | ICD-10-CM | POA: Diagnosis not present

## 2017-04-30 DIAGNOSIS — M549 Dorsalgia, unspecified: Secondary | ICD-10-CM | POA: Diagnosis not present

## 2017-04-30 DIAGNOSIS — G4700 Insomnia, unspecified: Secondary | ICD-10-CM | POA: Diagnosis not present

## 2017-04-30 DIAGNOSIS — E785 Hyperlipidemia, unspecified: Secondary | ICD-10-CM | POA: Diagnosis not present

## 2017-04-30 NOTE — Progress Notes (Signed)
Patient:  [name]   DOB: Sep 26, 1950  MR Number: [id number]  Location: [facility] [address], [place] [id number] [place] [address] Dept: [phone]  Start: 10 AM End: 11 AM  Provider/Observer:     Edgardo Roys PSYD  Chief Complaint:      Chief Complaint  Patient presents with  . Pain  . Sleeping Problem  . Tremors    Reason For Service:     [place] in [place] was referred by Dr. Maryjean Ka for psychological evaluation as part of the standard protocols for assessment for appropriateness for possible spinal cord stimulator trialing and implantation.  The patient describes dealing with significant chronic pain for the past 35 years.  The patient has had neurosurgical interventions in the past.  The patient reports that his pain has been much more severe recently.  The patient denies any history of depression or anxiety but does acknowledge ongoing sleep disturbance.  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  He reports that he will wake up in significant pain.  The patient does acknowledge that he has been diagnosed with Parkinson's-like symptoms in the past but the neurologist current feelings are that they are related to benign essential tremors.  Testing Administered:  The patient completed the [place] multiphasic personality inventory as well as the pain patient profile inventories.  Participation Level:   Active  Participation Quality:  Appropriate and Attentive      Behavioral Observation:  Well Groomed, Alert, and Appropriate.   Test Results:   Initially, the patient completed the MMPI-2.  The resulting validity scales suggest that the patient approach this measure in an honest and straightforward manner.  There is no indications that he either attempted to minimize her exaggerate  any current symptomatology.  The resulting basic scale/clinical scales do show a significant elevation on measures associated with both specific as well as vague physical and somatic complaints.  These elevations are consistent with the patient's medical history and self reports during the clinical interview.  No other clinical scale were in the elevated range relative to a normative population.  There were no indications of significant levels of depression, anxiety, anger, psychosis, manic episodes, or other significant psychiatric symptomatology.  Further analysis utilizing content scales do highlight this focus on primary health concerns and physical pain with no indications of anxiety, depression, obsessive thinking, family discord, or work difficulties.  Supplementary scales further highlight the absence of any significant psychological or psychiatric distress.  The patient does not have an elevation on a supplementary scale shown to be sensitive to vulnerability for alcohol or substance abuse.  The patient has no elevation on either of the PTSD scales and there are no indications of maladjustment or difficulties in social settings.  The patient does acknowledge symptoms related to fatigue and malaise as well as somatic complaints but these are the primary symptomatology that he is describing related to his chronic pain symptoms.  The patient also completed the pain patient profile, which is a measure to help differentiate condition such as some matization or somatoform disorder versus chronic pain symptoms and adjust for these somatic complaints relative to a pain patient population as well as adjust for the effects of pain on symptoms such as depression or anxiety.  The patient produced a valid profile with no indications of exaggeration  or attempts to minimize any current symptomatology on this measure.  The patient's level on the some matization scale while elevated relative to a normative community  sample without chronic pain was right on the mean level of difficulties relative to a chronic pain patient population without psychiatric illness.  This suggests that there is no indications of a some matization or somatoform disorder.  The patient produced the standardized profile scales for anxiety and depression equal to or below those typically seen with community/normative sample populations and significantly below those typically found with chronic pain patients.  This pattern suggest that there are no issues of significant anxiety or depression.   Summary of Results:   The results of th current objective psychological evaluation will do suggest that the patient is doing quite well from a psychological/psychiatric standpoint.  There are no indications of significant psychiatric illness such as psychoses or manic/depressive events.  There are no indications of significant depression or anxiety, social maladjustment or discord, no indications of pre-existing PTSD symptoms or significant vulnerability towards substance abuse/alcohol abuse.  Overall, the only significant objective features identified had to do with his difficulties directly related to chronic pain and physical difficulties/medical issues.  Impression/Diagnosis:   The results of the current objective psychological evaluation do suggest that the patient is likely to be an excellent candidate from a psychological/psychiatric perspective.  There are no indications of any significant psychological/psychiatric illness and the patient is not dealing with significant levels of depression or anxiety or adjustment issues.  There are also no indications of any significant psychosocial issues that would be overly problematic or specific hindrance during the trialing phase.  The patient is clear and is understanding of the potential risk and benefits of such a procedure and is able to accurately provide consent for such a procedure.  Diagnosis:    Axis  I: Chronic pain syndrome   Ilean Skill, Psy.D. Neuropsychologist

## 2017-05-01 ENCOUNTER — Encounter: Payer: Self-pay | Admitting: Neurology

## 2017-05-01 ENCOUNTER — Ambulatory Visit: Payer: 59 | Admitting: Neurology

## 2017-05-01 VITALS — BP 130/88 | HR 64 | Ht 71.0 in | Wt 230.0 lb

## 2017-05-01 DIAGNOSIS — G250 Essential tremor: Secondary | ICD-10-CM | POA: Diagnosis not present

## 2017-05-01 MED ORDER — PROPRANOLOL HCL 40 MG PO TABS
ORAL_TABLET | ORAL | 5 refills | Status: DC
Start: 2017-05-01 — End: 2018-07-15

## 2017-05-01 NOTE — Patient Instructions (Addendum)
Start propranolol 40mg  - take half tablet twice daily for 2 weeks, then take 1 tablet twice daily.  Call the office in 2 months with an update   Return to clinic 6 months

## 2017-05-01 NOTE — Progress Notes (Signed)
Follow-up Visit   Date: 05/01/17    [name] MRN: [id number] DOB: November 21, 1950   Interim History: [name] is a 67 y.o. right-handed Caucasian male with history of GERD, hypertension, hyperlipidemia, CTS s/p bilateral release, s/p cervical C4-5, C5-6, C6-7 decompression and fusion (2016), L3-4, L4-5 lumbar decompression and fusions returning to the clinic for evaluation of hand tremors.  He is a retired Therapist, art.   History of present illness: Around 2007, he developed gradual onset of bilateral numbness and tingling of the fingers, initially attributed to his neck so did not seek any medication attention. In 2012, he went to his PCP because of worsening problems with fine motor movements (utilizing bolts, tools) and dropping things. He had EMG which showed bilateral CTS and underwent right CTS release by Dr. [name] in 2014, with improvement of nighttime shooting pain, but no change in motor symptoms. He saw Dr. [name] in the fall of 2014 for second opinion and underwent left CTS release on 05/17/2013. Again, his tingling and shooting nocturnal symptoms improved, but he continues to have problems with dexterity and finger coordination.   Overall, there has been no worsening of paresthesias or weakness, but he is concerned because his fine motor movements did not improve following surgery. He works as a Education officer, museum frequently, which notes greater difficulty manipulating. Current symptoms include: constant numbness >> tingling of the hands and when he tries to use the hands, there is throbbing pain in the forearm and neck. He has associated weakness of the hands and finds himself dropping things such as keys, using screwdrivers, wrenches, etc.   He has long history of low back pain and does endorse numbness of the feet, which predated his upper extremity symptoms. He has previously been seen by Dr. [name] who did EMG in  late 1990s which showed nerve damage, left > right leg. He is taking gabapentin 891m BID for his feet, which helps.  In 2015, he had repeat NCS/EMG which showed no evidence of neuropathy or CTS on the left side, only mild L3-4 radiculopathy.  Neuropathy labs also returned normal.   UPDATE 04/23/2015:   Since he was last seen here, he underwent cervical C4-5, C5-6, C6-7 decompression and fusion in September 2016.  He is also s/p right L3-4, L4-5 fusion in October 2017 for spinal stenosos with herniated disc.  Today, complains of worsening hand and head tremor.  His tremor is functionally interfering with his fine motor skills, such has using tools.  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  Tremors are predominately present with he is working, but also at rest.  His head tremor is most noticeable at rest.  There is no change with alcohol or caffeine.  He has two family members with parkinson's disease, none with essential tremor.    UPDATE 11/28/2016:  He is here for follow-up of hand tremors.  He tried mirapex 0.1221mBID and did not appreciate any benefit, so stopped it.  He also developed nausea with this.  His tremors are always bothersome when he is trying to do activities with his hands, such as using his tools.  During the summer, he underwent another back surgery with Dr. StVertell Limbernd is also seeing Dr. HaMaryjean Kand considering a pain stimulator.  He will be moving into a smaller condominium in the next few months.  UPDATE 05/01/2017:  He is here for follow-up visit.  He was started on primidone 14m BID and did not appreciate any improvement and because of nausea and lightheadedness, he stopped it.  Overall, his tremors are not as severe as they were before.  It only bothers him when he is working with his mechanical tools, but has not been able to do this much because of his low back pain.  He will be having a trial of spinal cord  stimulator next week.    Medications:  Current Outpatient Medications on File Prior to Visit  Medication Sig Dispense Refill  . acetaminophen (TYLENOL) 325 MG tablet Take 325 mg by mouth every 6 (six) hours as needed for mild pain.    .Marland Kitchenalbuterol (PROVENTIL HFA;VENTOLIN HFA) 108 (90 BASE) MCG/ACT inhaler Inhale 2 puffs into the lungs every 6 (six) hours as needed for wheezing or shortness of breath (cough).     .Marland Kitchenatorvastatin (LIPITOR) 20 MG tablet TAKE 1 TABLET ONCE A DAY WITH SUPPER ORALLY FOR 30 DAYS    . celecoxib (CELEBREX) 200 MG capsule     . cyclobenzaprine (FLEXERIL) 10 MG tablet     . diclofenac (VOLTAREN) 75 MG EC tablet Take 75 mg by mouth daily as needed for mild pain.     .Marland Kitchenesomeprazole (NEXIUM) 20 MG capsule Take 40 mg by mouth daily.  0  . fluticasone (FLONASE) 50 MCG/ACT nasal spray Place 2 sprays into both nostrils daily as needed for allergies or rhinitis (congestion).     . gabapentin (NEURONTIN) 400 MG capsule Take 800 mg by mouth 2 (two) times daily.    .Marland KitchenHYDROcodone-acetaminophen (NORCO/VICODIN) 5-325 MG tablet     . lisinopril-hydrochlorothiazide (PRINZIDE,ZESTORETIC) 10-12.5 MG per tablet Take 1 tablet by mouth daily.    .Marland Kitchenloratadine (CLARITIN) 10 MG tablet Take 10 mg by mouth daily.     . methocarbamol (ROBAXIN) 750 MG tablet Take 1 tablet (750 mg total) by mouth every 6 (six) hours as needed for muscle spasms. 50 tablet 0  . Oxycodone HCl 10 MG TABS TAKE 1 TABLET BY MOUTH AS NEEDED 3 TIMES A DAY PER DAY FOR PAIN  0  . primidone (MYSOLINE) 50 MG tablet Take 0.5 tablets (25 mg total) by mouth 2 (two) times daily. 30 tablet 5  . Probiotic Product (ALIGN) 4 MG CAPS Take 4 mg by mouth daily.     . ranitidine (ZANTAC) 150 MG tablet Take 150 mg by mouth at bedtime.    . tamsulosin (FLOMAX) 0.4 MG CAPS capsule Take 0.4 mg by mouth daily with breakfast.  6  . tiZANidine (ZANAFLEX) 2 MG tablet Take 2 mg by mouth 2 (two) times daily.    . traZODone (DESYREL) 150 MG tablet  Take 150 mg by mouth at bedtime. For sleep    . VOLTAREN 1 % GEL APPLY TWO GRAMS FOR UPPER EXTREMITY FOUR TIMES DAILY  1  . zolpidem (AMBIEN) 10 MG tablet Take 10 mg by mouth at bedtime.     No current facility-administered medications on file prior to visit.     Allergies: No Known Allergies   Review of Systems:  CONSTITUTIONAL: No fevers, chills, night sweats, or weight loss.  EYES: No visual changes or eye pain ENT: No hearing changes.  No history of nose bleeds.   RESPIRATORY: No cough, wheezing and shortness of breath.   CARDIOVASCULAR: Negative for chest pain, and palpitations.   GI: Negative for abdominal discomfort, blood in stools or black  stools.  No recent change in bowel habits.   GU:  No history of incontinence.   MUSCLOSKELETAL: +history of joint pain or swelling.  No myalgias.   SKIN: Negative for lesions, rash, and itching.   ENDOCRINE: Negative for cold or heat intolerance, polydipsia or goiter.   PSYCH:  No depression or anxiety symptoms.   NEURO: As Above.   Vital Signs:  BP 130/88   Pulse 64   Ht 5' 11" (1.803 m)   Wt 230 lb (104.3 kg)   SpO2 98%   BMI 32.08 kg/m   General:  Well appearing, comfortable  Neurological Exam: MENTAL STATUS including orientation to time, place, person, recent and remote memory, attention span and concentration, language, and fund of knowledge is normal.  Speech is not dysarthric.  Mildly blunted affect.  CRANIAL NERVES:  Pupils equal round and reactive to light.  Normal conjugate, extra-ocular eye movements in all directions of gaze.  No ptosis.  Face is symmetric.   MOTOR:  Motor strength is 5/5 in all extremities.  There is bilateral intention tremor of the hands, absent tremor at rest.   Tone is normal.    MSRs:  Reflexes are 2+/4 throughout, except 1+ bilateral Achilles  COORDINATION/GAIT:   Intact rapid alternating movements bilaterally.  Gait appears antalgic due to right heel pain, unassisted.  Data: Labs  11/01/2013:  GTT 103/133/99, ESR 5, TSH 1.99, B12 318, vitamin B1 16, copper 103, ceruloplasmin 29, SPEP/UPEP with IFE no M protein  MRI cervical spine Jun 05, 2012:  Pronounced spondylosis at C4-5 and C5-6. There is foraminal stenosis of both levels bilaterally. Either or both C5 and/or C6 nerve roots could be compressed. The canal is narrowed at both levels, more so at C5-6, where there is slight indentation of the ventral cord.   EMG dated 11/29/2012 performed at [facility] [place]:  Bilateral CTS, mild-moderate in degree.  Bilateral median sensory and motor latencies are prolonged, worse on the right (sensory R 2.6 L 2.19m, motor: R 4.446m L 4.46m7m with reduced sensory amplitudes (R 16.7, 19.0) and preserved motor amplitudes.  [facility] 1. Chronic L3-L4 radiculopathy affecting the left side, mild in degree electrically. 2. There is no evidence of a generalized sensorimotor polyneuropathy, carpal tunnel syndrome, or cervical radiculopathy affecting the left side.  MRI lumbar spine 02/10/2015: 1. Moderate size left extraforaminal disc extrusion at L4-5 with resulting left L4 nerve root encroachment. 2. 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 There is resulting mild multifactorial spinal stenosis at L3-4 and L4-5. 3. Chronic degenerative disc disease at L5-S1 with endplate osteophytes and facet hypertrophy contributing to chronic osseous foraminal narrowing bilaterally.  MRI brain wo contrast 05/16/2016: 1. No acute intracranial abnormality. 2. Mild cerebral white matter T2 signal changes, nonspecific though most often seen with chronic small vessel ischemia.  CT lumbar spine wo contrast 05/27/2016:  1. L3-L5 fusion with improved spinal canal patency. Early osseous fusion across the L4-5 disc space. No evidence of hardware Complication.  2. Unchanged advanced disc degeneration at L5-S1 with  moderate foraminal stenosis.  IMPRESSION.PLAN: 1.   Benign essential tremor  - Start propranolol 91m746mice daily for 2 weeks, then increase to 40mg91mce daily.  - Discontinue mirapex due to no benefit and side effects (nausea), primidone (no benefit)  - He already takes gabapentin 800mg 91mfor pain, which can also be used for tremors   2.  S/p cervical and lumbar decompression  and fusion s/p anterior lumbar interbody fusion L5-S1 in May by Dr. Vertell Limber and followed by Dr. Maryjean Ka for pain management and undergoing evaluation for spinal cord stimulator  Return to clinic in 6 months  Greater than 50% of this 25 minute visit was spent in counseling, explanation of diagnosis, planning of further management, and coordination of care.  Thank you for allowing me to participate in patient's care.  If I can answer any additional questions, I would be pleased to do so.    Sincerely,    Donika K. Posey Pronto, DO

## 2017-09-29 ENCOUNTER — Telehealth: Payer: Self-pay | Admitting: Neurology

## 2017-09-29 NOTE — Telephone Encounter (Signed)
Patient states the last time he was in [place] discussed possibly doing a test to check is he had Neuropathy. Patient states he is having extreme foot pain now and wants to know if the test can be done when he comes in for appt on 8.9.19 or does he need to be referred somewhere else.

## 2017-09-29 NOTE — Telephone Encounter (Signed)
Spoke with pt.  Advised him that EMG and follow up appointment are usually not done at the same time.  Pt states that he would like to have EMG done sooner rather than later - he would also like to have both appointments at the same time, but understands that that might not be possible.  Let pt know that that would have to be approved by Dr. Posey Pronto.  Told pt that Dr. Posey Pronto is out of the office this week, but that I would place order and Caryl Pina will give him a call next week with Dr. Serita Grit response.  Pt appreciative.

## 2017-09-29 NOTE — Telephone Encounter (Signed)
Order placed and sent to Dr. Posey Pronto for approval

## 2017-10-01 IMAGING — CT CT RENAL STONE PROTOCOL
2 of 4 series · 16 of 46 positions shown, 18 images · non-contrast
Comparison: 01/16/2012

CLINICAL DATA: Left flank pain for 2 weeks, worsening today. Pain
extending to the left leg.

EXAM:
CT ABDOMEN AND PELVIS WITHOUT CONTRAST
TECHNIQUE: Multidetector CT imaging of the abdomen and pelvis was performed
following the standard protocol without IV contrast.

[Series 2: renal stone 5mm · axial · 0.82mm/px · z∈[+865,+1320]mm · 13 of 101 slices shown, 15 images]
[im 5/101  soft-tissue]
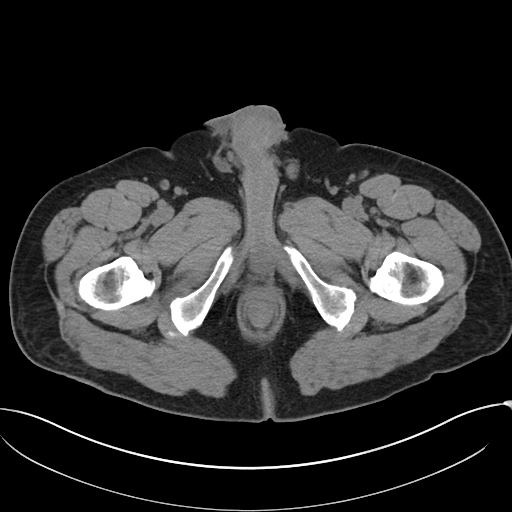
[im 5/101  bone]
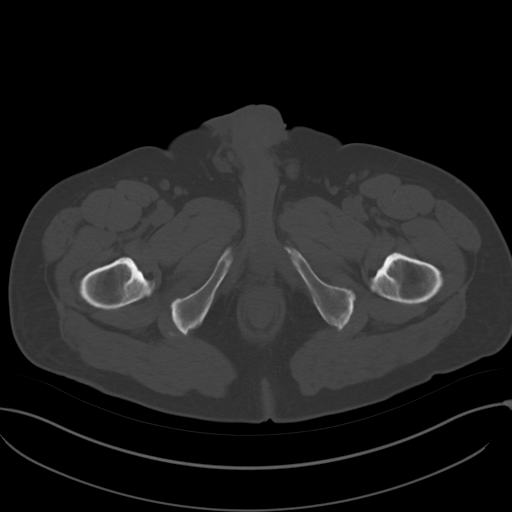
[im 14/101  soft-tissue]
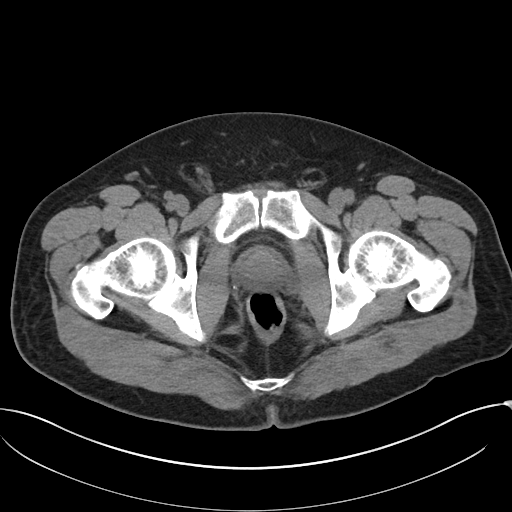
[im 22/101  soft-tissue]
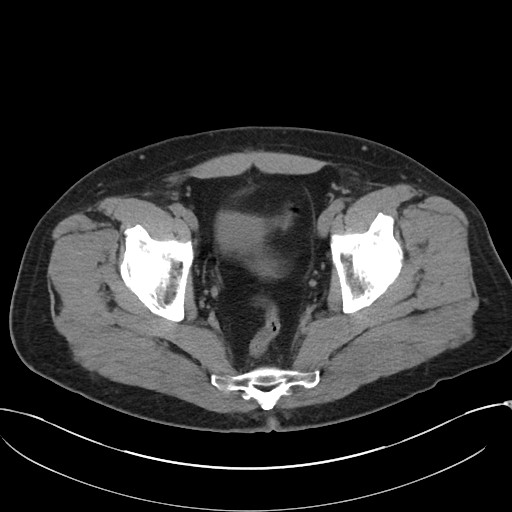
[im 27/101  soft-tissue]
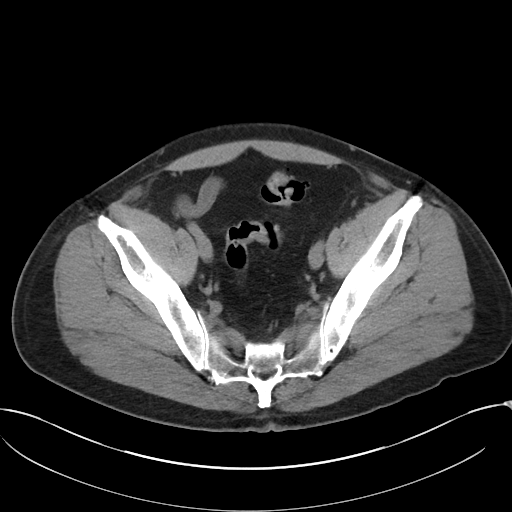
[im 35/101  soft-tissue]
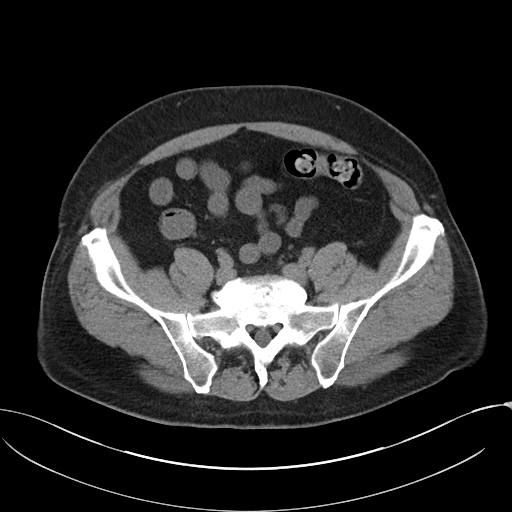
[im 44/101  soft-tissue]
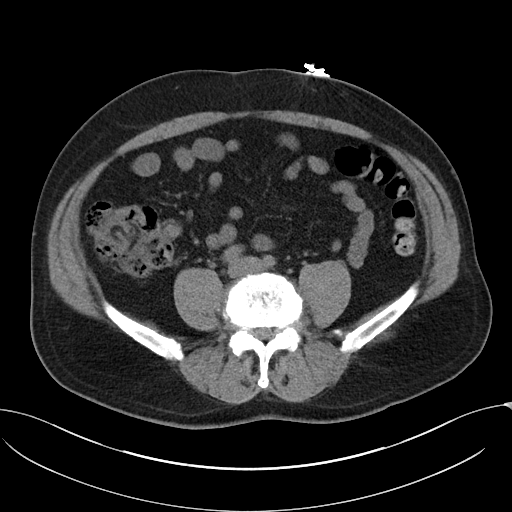
[im 53/101  soft-tissue]
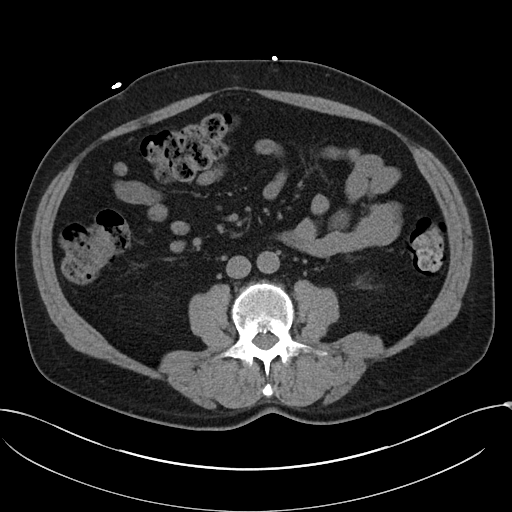
[im 57/101  soft-tissue]
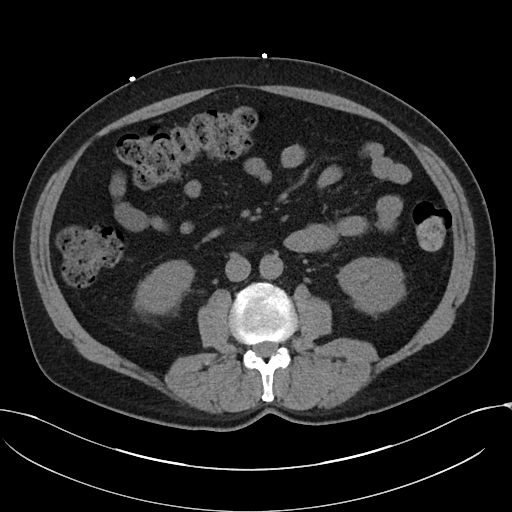
[im 66/101  soft-tissue]
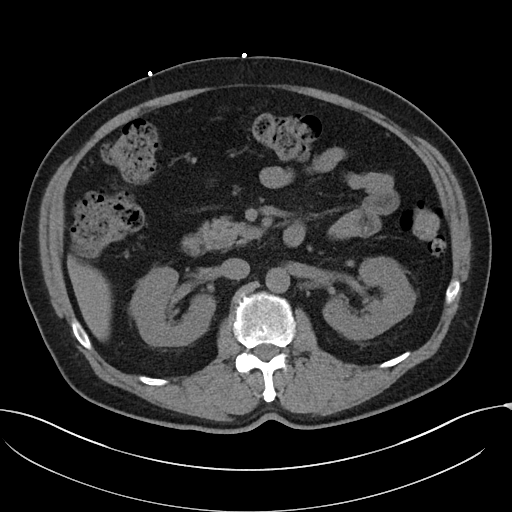
[im 66/101  bone]
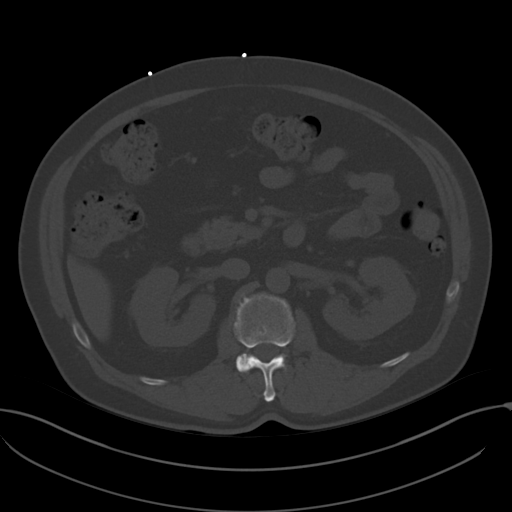
[im 74/101  soft-tissue]
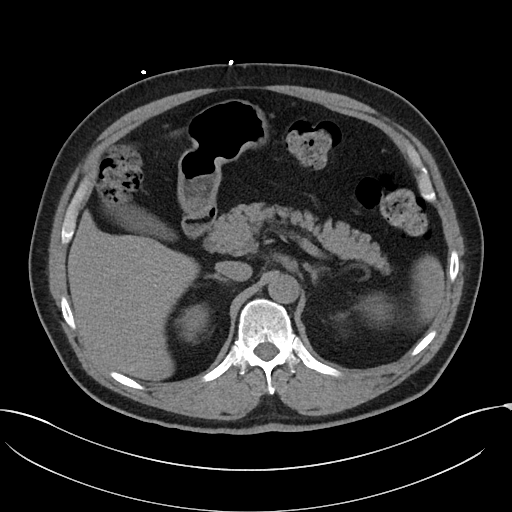
[im 79/101  soft-tissue]
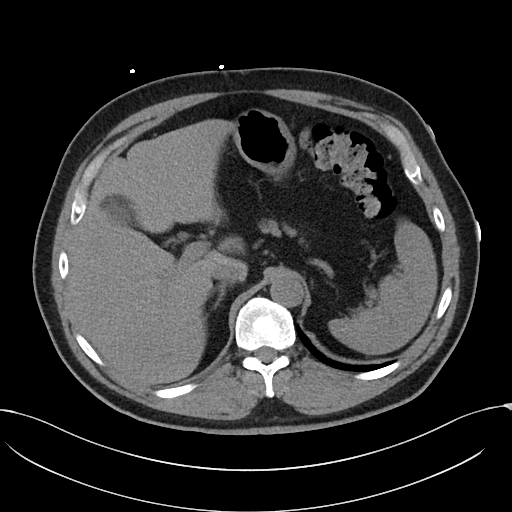
[im 87/101  soft-tissue]
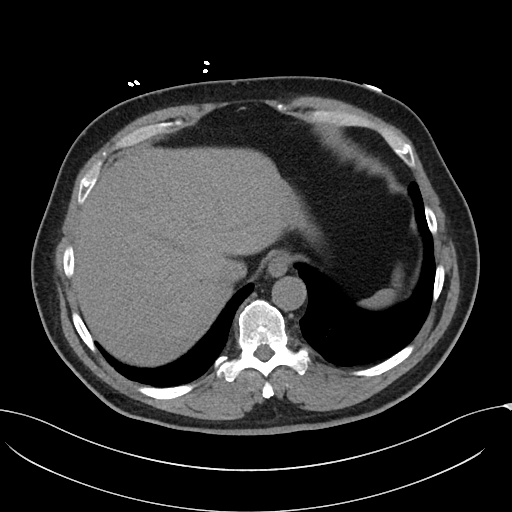
[im 96/101  soft-tissue]
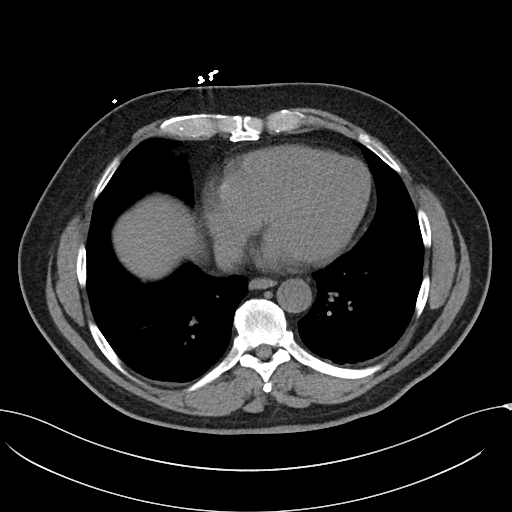

[Series 4: renal stone 3.0 cor · coronal · 0.80mm/px · 3 of 96 slices shown]
[im 32/96  soft-tissue]
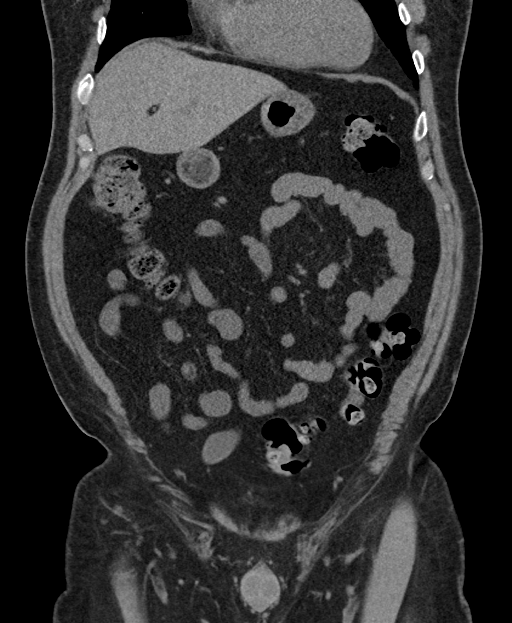
[im 43/96  soft-tissue]
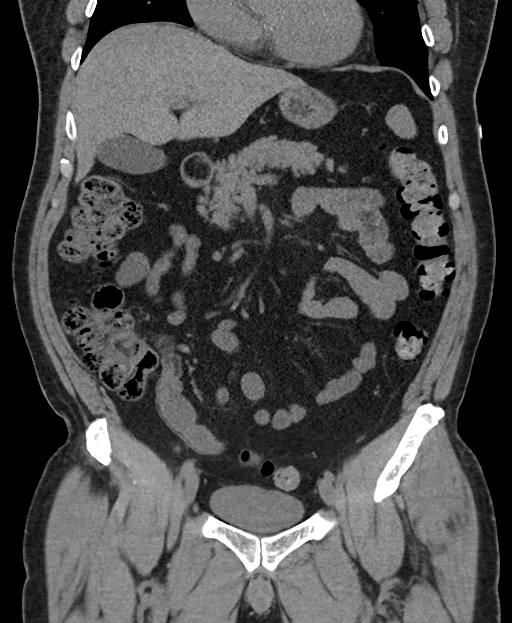
[im 53/96  soft-tissue]
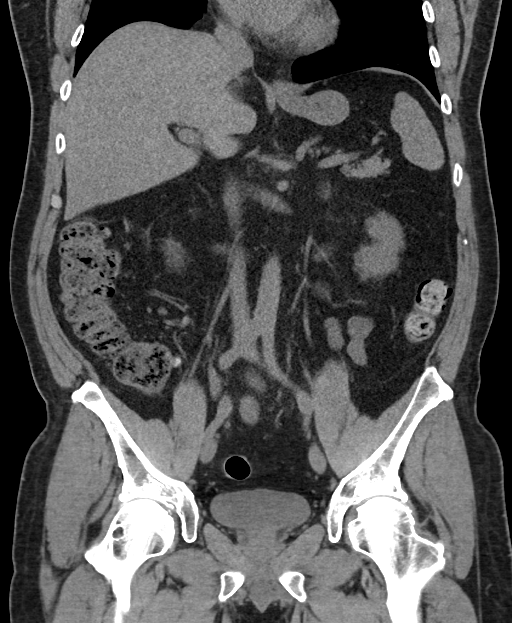

[16 of 46 positions shown; findings below may reference images not displayed]

FINDINGS: Lung bases:  Clear.  Heart normal in size.

Liver:  Fatty infiltration.  No mass or focal lesion.

Spleen, gallbladder, pancreas, adrenal glands:  Normal.

Kidneys, ureters, bladder: No renal masses. No renal or ureteral
stones. No hydronephrosis. Ureters normal in course and caliber.
Bladder is unremarkable.

Lymph nodes:  No adenopathy.

Ascites:  None.

Gastrointestinal: There scattered colonic diverticula with no
evidence of diverticulitis. Colon otherwise unremarkable. Normal
stomach and small bowel.

Musculoskeletal: There are degenerative changes of the visualized
spine. No osteoblastic or osteolytic lesions.
IMPRESSION: 1. No acute findings. No evidence of renal or ureteral stones or
obstructive uropathy.
2. Hepatic steatosis. Degenerative changes noted throughout the
visualized spine. Scattered colonic diverticula without
diverticulitis.

## 2017-10-06 NOTE — Telephone Encounter (Signed)
Patient will need a f/u visit because this is a new complaint that I have not assessed him for and then I will determine whether NCS/EMG is indicated based on his symptoms. His last appointment was for tremors and he is followed by pain management for pain.  He has had EMG of the left leg in 2015 which indicated nerve impingement in the back, no signs of neuropathy.    Savahna Casados K. Posey Pronto, DO

## 2017-10-12 NOTE — Telephone Encounter (Signed)
Patient notified of appointment on 10-30-17 at 9:30.

## 2017-10-30 ENCOUNTER — Encounter: Payer: Self-pay | Admitting: Neurology

## 2017-10-30 ENCOUNTER — Ambulatory Visit: Payer: 59 | Admitting: Neurology

## 2017-10-30 VITALS — BP 104/70 | HR 56 | Ht 71.0 in | Wt 232.2 lb

## 2017-10-30 DIAGNOSIS — M5416 Radiculopathy, lumbar region: Secondary | ICD-10-CM | POA: Diagnosis not present

## 2017-10-30 DIAGNOSIS — G250 Essential tremor: Secondary | ICD-10-CM | POA: Diagnosis not present

## 2017-10-30 NOTE — Progress Notes (Signed)
Follow-up Visit   Date: 10/30/17    [name] MRN: [id number] DOB: Mar 08, 1951   Interim History: [name] is a 67 y.o. right-handed Caucasian male with history of GERD, hypertension, hyperlipidemia, bilateral CTS s/p release, s/p cervical C4-5, C5-6, C6-7 decompression and fusion (2016), s/p L3-4 and L4-5 lumbar decompression and fusion (2017), and chronic low back pain s/p spinal cord stimulator returning to the clinic for bilateral leg pain and hand tremors.  He is a retired Therapist, art.   History of present illness: He had bilateral CTS release however continues to have numbness and problems with dexterity in the hands.  He works as a Education officer, museum frequently, which notes greater difficulty manipulating. He has constant numbness >> tingling of the hands and when he tries to use the hands, there is throbbing pain in the forearm and neck. He has associated weakness of the hands and finds himself dropping things such as keys, using screwdrivers, wrenches, etc.   He has long history of low back pain and does endorse numbness of the feet, which predated his upper extremity symptoms. He has previously been seen by Dr. [name] who did EMG in late 1990s which showed nerve damage, left > right leg. He is taking gabapentin 858m BID for his feet, which helps.  He is followed by Dr. HMaryjean Kafor low back pain and has spinal cord stimulator.  In 2015, he had repeat NCS/EMG which showed no evidence of neuropathy or CTS on the left side, only mild L3-4 radiculopathy.  Neuropathy labs also returned normal.   In early 2017, he presented with bilateral hand and head tremor, which interferes with fine motor skills such as working with tools.  He did not have benefit with mirapex or primidone.  UPDATE 10/30/2017:   Today, he complains of burning pain over the thigh, lower leg, and top of the feet, which spares the toes and soles. Pain  started since his L4-5 disc rupture.  He was concerned that pain may be stemming from neuropathy.  Drs. HMaryjean Kaand SVertell Limberhave mentioned that pain is due to lumbar radiculopathy and he would like my opinion.   Regarding his tremors, he thought that propanol may be caused GI upset, so stopped the medication, but realized that symptoms persisted so does not think it's medication related.  He did not restart the propranolol.  Tremors are intermittent, however, can be severe at times especially with fine motor tasks.   Medications:  Current Outpatient Medications on File Prior to Visit  Medication Sig Dispense Refill  . acetaminophen (TYLENOL) 325 MG tablet Take 325 mg by mouth every 6 (six) hours as needed for mild pain.    .Marland Kitchenalbuterol (PROVENTIL HFA;VENTOLIN HFA) 108 (90 BASE) MCG/ACT inhaler Inhale 2 puffs into the lungs every 6 (six) hours as needed for wheezing or shortness of breath (cough).     .Marland Kitchenatorvastatin (LIPITOR) 20 MG tablet TAKE 1 TABLET ONCE A DAY WITH SUPPER ORALLY FOR 30 DAYS    . celecoxib (CELEBREX) 200 MG capsule     . cyclobenzaprine (FLEXERIL) 10 MG tablet     . diclofenac (VOLTAREN) 75 MG EC tablet Take 75 mg by mouth daily as needed for mild pain.     .Marland Kitchenesomeprazole (NEXIUM) 20 MG capsule Take 40 mg by mouth daily.  0  . fluticasone (FLONASE) 50 MCG/ACT nasal spray Place 2 sprays into both nostrils daily as needed for allergies or  rhinitis (congestion).     . gabapentin (NEURONTIN) 400 MG capsule Take 800 mg by mouth 2 (two) times daily.    Marland Kitchen HYDROcodone-acetaminophen (NORCO/VICODIN) 5-325 MG tablet     . lisinopril-hydrochlorothiazide (PRINZIDE,ZESTORETIC) 10-12.5 MG per tablet Take 1 tablet by mouth daily.    Marland Kitchen loratadine (CLARITIN) 10 MG tablet Take 10 mg by mouth daily.     . methocarbamol (ROBAXIN) 750 MG tablet Take 1 tablet (750 mg total) by mouth every 6 (six) hours as needed for muscle spasms. 50 tablet 0  . Oxycodone HCl 10 MG TABS TAKE 1 TABLET BY MOUTH AS NEEDED 3  TIMES A DAY PER DAY FOR PAIN  0  . Probiotic Product (ALIGN) 4 MG CAPS Take 4 mg by mouth daily.     . propranolol (INDERAL) 40 MG tablet Take half tablet twice daily for 2 weeks, then take 1 tablet twice daily. 60 tablet 5  . ranitidine (ZANTAC) 150 MG tablet Take 150 mg by mouth at bedtime.    . tamsulosin (FLOMAX) 0.4 MG CAPS capsule Take 0.4 mg by mouth daily with breakfast.  6  . tiZANidine (ZANAFLEX) 2 MG tablet Take 2 mg by mouth 2 (two) times daily.    . traZODone (DESYREL) 150 MG tablet Take 150 mg by mouth at bedtime. For sleep    . VOLTAREN 1 % GEL APPLY TWO GRAMS FOR UPPER EXTREMITY FOUR TIMES DAILY  1  . zolpidem (AMBIEN) 10 MG tablet Take 10 mg by mouth at bedtime.     No current facility-administered medications on file prior to visit.     Allergies: No Known Allergies   Review of Systems:  CONSTITUTIONAL: No fevers, chills, night sweats, or weight loss.  EYES: No visual changes or eye pain ENT: No hearing changes.  No history of nose bleeds.   RESPIRATORY: No cough, wheezing and shortness of breath.   CARDIOVASCULAR: Negative for chest pain, and palpitations.   GI: Negative for abdominal discomfort, blood in stools or black stools.  No recent change in bowel habits.   GU:  No history of incontinence.   MUSCLOSKELETAL: +history of joint pain or swelling.  No myalgias.   SKIN: Negative for lesions, rash, and itching.   ENDOCRINE: Negative for cold or heat intolerance, polydipsia or goiter.   PSYCH:  No depression or anxiety symptoms.   NEURO: As Above.   Vital Signs:  BP 104/70   Pulse (!) 56   Ht _0  (1.803 m)   Wt 232 lb 4 oz (105.3 kg)   SpO2 97%   BMI 32.39 kg/m   General Medical Exam:   General:  Well appearing, comfortable  Eyes/ENT: see cranial nerve examination.   Neck: No masses appreciated.  Full range of motion without tenderness.  No carotid bruits. Respiratory:  Clear to auscultation, good air entry bilaterally.   Cardiac:  Regular rate and  rhythm, no murmur.   Ext:  No edema  Neurological Exam: MENTAL STATUS including orientation to time, place, person, recent and remote memory, attention span and concentration, language, and fund of knowledge is normal.  Speech is not dysarthric.  Mildly blunted affect.  CRANIAL NERVES:  Pupils equal round and reactive to light.  Normal conjugate, extra-ocular eye movements in all directions of gaze.  No ptosis.  Face is symmetric.   MOTOR:  Motor strength is 5/5 in all extremities.  There is bilateral intention tremor of the hands, absent tremor at rest.   Tone is normal.  MSRs:  Right                                                                 Left brachioradialis 2+  brachioradialis 2+  biceps 2+  biceps 2+  triceps 2+  triceps 2+  patellar 2+  patellar 1+  ankle jerk 2+  ankle jerk 2+  Hoffman no  Hoffman no  plantar response down  plantar response down    COORDINATION/GAIT:   Intact rapid alternating movements bilaterally.  Gait appears slow, unassisted and stable.  Data: Labs 11/01/2013:  GTT 103/133/99, ESR 5, TSH 1.99, B12 318, vitamin B1 16, copper 103, ceruloplasmin 29, SPEP/UPEP with IFE no M protein  MRI cervical spine 05-31-12:  Pronounced spondylosis at C4-5 and C5-6. There is foraminal stenosis of both levels bilaterally. Either or both C5 and/or C6 nerve roots could be compressed. The canal is narrowed at both levels, more so at C5-6, where there is slight indentation of the ventral cord.   EMG dated 11/29/2012 performed at [place] and [facility], [place]:  Bilateral CTS, mild-moderate in degree.  Bilateral median sensory and motor latencies are prolonged, worse on the right (sensory R 2.6 L 2.55m, motor: R 4.439m L 4.23m36m with reduced sensory amplitudes (R 16.7, 19.0) and preserved motor amplitudes.  [facility] 1. Chronic L3-L4 radiculopathy affecting the left side, mild in degree  electrically. 2. There is no evidence of a generalized sensorimotor polyneuropathy, carpal tunnel syndrome, or cervical radiculopathy affecting the left side.  MRI lumbar spine 02/10/2015: 1. Moderate size left extraforaminal disc extrusion at L4-5 with resulting left L4 nerve root encroachment. 2. 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 There is resulting mild multifactorial spinal stenosis at L3-4 and L4-5. 3. Chronic degenerative disc disease at L5-S1 with endplate osteophytes and facet hypertrophy contributing to chronic osseous foraminal narrowing bilaterally.  MRI brain wo contrast 05/16/2016: 1. No acute intracranial abnormality. 2. Mild cerebral white matter T2 signal changes, nonspecific though most often seen with chronic small vessel ischemia.  CT lumbar spine wo contrast 05/27/2016:  1. L3-L5 fusion with improved spinal canal patency. Early osseous fusion across the L4-5 disc space. No evidence of hardware Complication.  2. Unchanged advanced disc degeneration at L5-S1 with moderate foraminal stenosis.  IMPRESSION.PLAN: 1.   Bilateral leg pain follows L4-5 dermatome and is due to chronic radiculopathy, no signs of peripheral neuropathy on exam.  I do not think that NCS/EMG is warranted, especially as it would not change management.  I reviewed his electrodiagnostic testing from 2015, which did not show neuropathy.  Reassurance provided.  Continue pain management as per Dr. HarMaryjean Ka.  Benign essential tremor, intermittent and occasionally interfering with fine motor tasks.  - OK to take propranolol 20-37m30m needed and if tolerating, take twice daily on a schedule  Return to clinic in 9 months   Thank you for allowing me to participate in patient's care.  If I can answer any additional questions, I would be pleased to do so.    Sincerely,    India Jolin K. PatePosey Pronto

## 2017-10-30 NOTE — Patient Instructions (Signed)
OK to take propranolol 20mg  - 40mg  as needed.  You can also take it twice daily, if tolerating.  Return to clinic in 9 months

## 2017-11-27 ENCOUNTER — Encounter: Payer: Self-pay | Admitting: Neurology

## 2018-03-24 HISTORY — PX: BACK SURGERY: SHX140

## 2018-07-14 ENCOUNTER — Encounter: Payer: Self-pay | Admitting: *Deleted

## 2018-07-15 ENCOUNTER — Other Ambulatory Visit: Payer: Self-pay

## 2018-07-15 ENCOUNTER — Telehealth (INDEPENDENT_AMBULATORY_CARE_PROVIDER_SITE_OTHER): Payer: Medicare Other | Admitting: Neurology

## 2018-07-15 ENCOUNTER — Encounter: Payer: Self-pay | Admitting: *Deleted

## 2018-07-15 ENCOUNTER — Encounter: Payer: Self-pay | Admitting: Neurology

## 2018-07-15 DIAGNOSIS — G250 Essential tremor: Secondary | ICD-10-CM

## 2018-07-15 MED ORDER — PROPRANOLOL HCL 40 MG PO TABS: 40.0000 mg | ORAL_TABLET | Freq: Two times a day (BID) | ORAL | 3 refills | Status: DC

## 2018-07-15 NOTE — Progress Notes (Signed)
   Virtual Visit via Video Note The purpose of this virtual visit is to provide medical care while limiting exposure to the novel coronavirus.    Consent was obtained for video visit:  Yes.   Answered questions that patient had about telehealth interaction:  Yes.   I discussed the limitations, risks, security and privacy concerns of performing an evaluation and management service by telemedicine. I also discussed with the patient that there may be a patient responsible charge related to this service. The patient expressed understanding and agreed to proceed.  Pt location: Home Physician Location: office Name of referring provider:  Orpah Melter, MD I connected with Wadie Lessen at patients initiation/request on 07/15/2018 at 10:00 AM EDT by video enabled telemedicine application and verified that I am speaking with the correct person using two identifiers. Pt MRN:  [id number] Pt DOB:  Sep 14, 1950 Video Participants:  Wadie Lessen   History of Present Illness: This is a 68 y.o. male returning for follow-up of essential tremor and lumbar radiculopathy.  His essential tremor is doing fairly okay.    He takes propranolol 40 mg daily, although it is prescribed twice daily.  He has noticed occasional GI upset when he takes it twice a day.  He has noticed that tremors are much worse when his low back pain is exacerbated.  He has known bilateral leg pain due to L5 radiculopathy.  He is seeking a second opinion with neurosurgery at [facility] and tells me that he is supposed to be scheduled for another surgery, but this has been held due to [place].   Observations/Objective:   Vitals:   07/15/18 0953  Weight: 244 lb (110.7 kg)  Height: 5\' 11"  (1.803 m)   Patient is awake, alert, and appears comfortable.  Oriented x 4.   Extraocular muscles are intact. No ptosis.  Face is symmetric.  Speech is not dysarthric. Tongue is midline. Antigravity in all extremities.  No pronator drift.  Very mild  tremors are seen when hands are outstretched. Gait appears normal.  Finger tapping is normal.  No dysmetria   Assessment and Plan:  1.  Benign essential tremor, stable.  Continue propranolol 40mg  twice daily, refills provided for one year.  2.  History of L3-4 and L4-5 lumbar decompression and fusion in 2017 with repeat by foraminal stenosis at L5 causing chronic low back pain and leg pain.  He is seeking second opinion with Dr. Aris Everts at [facility] neurosurgery who is planning redo surgery.    Follow Up Instructions:   I discussed the assessment and treatment plan with the patient. The patient was provided an opportunity to ask questions and all were answered. The patient agreed with the plan and demonstrated an understanding of the instructions.   The patient was advised to call back or seek an in-person evaluation if the symptoms worsen or if the condition fails to improve as anticipated.  Follow-up in 1 year  Alda Berthold, DO

## 2018-08-02 ENCOUNTER — Ambulatory Visit: Payer: 59 | Admitting: Neurology

## 2019-07-08 ENCOUNTER — Ambulatory Visit: Payer: Medicare Other | Admitting: Neurology

## 2019-07-15 ENCOUNTER — Ambulatory Visit (INDEPENDENT_AMBULATORY_CARE_PROVIDER_SITE_OTHER): Payer: Medicare Other | Admitting: Neurology

## 2019-07-15 ENCOUNTER — Other Ambulatory Visit: Payer: Self-pay

## 2019-07-15 ENCOUNTER — Encounter: Payer: Self-pay | Admitting: Neurology

## 2019-07-15 VITALS — BP 142/83 | HR 86 | Ht 71.0 in | Wt 235.8 lb

## 2019-07-15 DIAGNOSIS — G250 Essential tremor: Secondary | ICD-10-CM

## 2019-07-15 MED ORDER — PROPRANOLOL HCL 40 MG PO TABS: 40.0000 mg | ORAL_TABLET | Freq: Two times a day (BID) | ORAL | 3 refills | Status: AC

## 2019-07-15 NOTE — Patient Instructions (Addendum)
Start propranolol 40mg  twice daily  Return to clinic 1 year

## 2019-07-15 NOTE — Progress Notes (Signed)
Follow-up Visit   Date: 07/15/19    [name] MRN: [id number] DOB: 1951/02/09   Interim History: [name] is a 69 y.o. right-handed Caucasian male with history of GERD, hypertension, hyperlipidemia, bilateral CTS s/p release, s/p cervical C4-5, C5-6, C6-7 decompression and fusion (2016), s/p L3-4 and L4-5 lumbar decompression and fusion (2017), and chronic low back pain s/p spinal cord stimulator returning to the clinic for hand tremors.  He is a retired Therapist, art.   History of present illness: He had bilateral CTS release however continues to have numbness and problems with dexterity in the hands.  He has associated weakness of the hands and finds himself dropping things such as keys, using screwdrivers, wrenches, etc.   He has long history of low back pain and does endorse numbness of the feet, which predated his upper extremity symptoms. He has previously been seen by Dr. Erling Guzman who did EMG in late 1990s which showed nerve damage, left > right leg. He is taking gabapentin '800mg'$  BID for his feet, which helps.  He is followed by Dr. Maryjean Guzman for low back pain and has spinal cord stimulator.  In 2015, he had repeat NCS/EMG which showed no evidence of neuropathy or CTS on the left side, only mild L3-4 radiculopathy.  Neuropathy labs also returned normal.   In early 2017, he presented with bilateral hand and head tremor, which interferes with fine motor skills such as working with tools.  He did not have benefit with mirapex or primidone.  UPDATE 07/15/2019:  He is here for follow-up visit for tremors.  He was prescribed propranolol '40mg'$  twice daily but stopped it in July when he had redo back surgery.  Since this time, he has noticed mild worsening of hand tremors, something interfering with his ability to write.    He continues to have is significant low back pain radiating into the legs and has history of lumbar surgery s/p spinal cord  stimulator.  He is [facility] at [facility] and will be getting ESI.   Medications:  Current Outpatient Medications on File Prior to Visit  Medication Sig Dispense Refill  . acetaminophen (TYLENOL) 325 MG tablet Take 325 mg by mouth every 6 (six) hours as needed for mild pain.    Marland Kitchen albuterol (PROVENTIL HFA;VENTOLIN HFA) 108 (90 BASE) MCG/ACT inhaler Inhale 2 puffs into the lungs every 6 (six) hours as needed for wheezing or shortness of breath (cough).     Marland Kitchen amLODipine (NORVASC) 5 MG tablet     . atorvastatin (LIPITOR) 40 MG tablet     . carvedilol (COREG) 12.5 MG tablet     . celecoxib (CELEBREX) 200 MG capsule     . cyclobenzaprine (FLEXERIL) 10 MG tablet     . diclofenac (VOLTAREN) 75 MG EC tablet Take 75 mg by mouth daily as needed for mild pain.     Marland Kitchen esomeprazole (NEXIUM) 20 MG capsule Take 40 mg by mouth daily.  0  . fluticasone (FLONASE) 50 MCG/ACT nasal spray Place 2 sprays into both nostrils daily as needed for allergies or rhinitis (congestion).     . gabapentin (NEURONTIN) 400 MG capsule Take 800 mg by mouth 2 (two) times daily.    Marland Kitchen HYDROcodone-acetaminophen (NORCO/VICODIN) 5-325 MG tablet     . levETIRAcetam (KEPPRA) 500 MG tablet     . lisinopril-hydrochlorothiazide (PRINZIDE,ZESTORETIC) 10-12.5 MG per tablet Take 1 tablet by mouth daily.    Marland Kitchen loratadine (CLARITIN) 10 MG tablet  Take 10 mg by mouth daily.     . methocarbamol (ROBAXIN) 750 MG tablet Take 1 tablet (750 mg total) by mouth every 6 (six) hours as needed for muscle spasms. 50 tablet 0  . montelukast (SINGULAIR) 10 MG tablet Take by mouth.    . naloxegol oxalate (MOVANTIK) 25 MG TABS tablet Take by mouth.    . Oxycodone HCl 10 MG TABS TAKE 1 TABLET BY MOUTH AS NEEDED 3 TIMES A DAY PER DAY FOR PAIN  0  . Probiotic Product (ALIGN) 4 MG CAPS Take 4 mg by mouth daily.     . propranolol (INDERAL) 40 MG tablet Take 1 tablet (40 mg total) by mouth 2 (two) times daily. 180 tablet 3  . QVAR REDIHALER 40 MCG/ACT inhaler     .  ranitidine (ZANTAC) 150 MG tablet Take 150 mg by mouth at bedtime.    . tamsulosin (FLOMAX) 0.4 MG CAPS capsule Take 0.4 mg by mouth daily with breakfast.  6  . tiZANidine (ZANAFLEX) 2 MG tablet Take 2 mg by mouth 2 (two) times daily.    . traZODone (DESYREL) 150 MG tablet Take 150 mg by mouth at bedtime. For sleep    . VOLTAREN 1 % GEL APPLY TWO GRAMS FOR UPPER EXTREMITY FOUR TIMES DAILY  1  . zolpidem (AMBIEN) 10 MG tablet Take 10 mg by mouth at bedtime.     No current facility-administered medications on file prior to visit.    Allergies: No Known Allergies   Vital Signs:  BP (!) 142/83   Pulse 86   Ht '5\' 11"'$  (1.803 m)   Wt 235 lb 12.8 oz (107 kg)   SpO2 98%   BMI 32.89 kg/m   Neurological Exam: MENTAL STATUS including orientation to time, place, person, recent and remote memory, attention span and concentration, language, and fund of knowledge is normal.  Speech is not dysarthric.  CRANIAL NERVES:   Normal conjugate, extra-ocular eye movements in all directions of gaze.  No ptosis.  MOTOR:  Motor strength is 5/5 in all extremities.  Mild bilateral hand tremor and head tremor.  Tone is normal.    COORDINATION/GAIT:   Intact rapid alternating movements bilaterally.  Gait appears slow, unassisted and stable, somewhat antalgic due to low back pain.  Data: Labs 11/01/2013:  GTT 103/133/99, ESR 5, TSH 1.99, B12 318, vitamin B1 16, copper 103, ceruloplasmin 29, SPEP/UPEP with IFE no M protein  MRI cervical spine 2012-05-26:  Pronounced spondylosis at C4-5 and C5-6. There is foraminal stenosis of both levels bilaterally. Either or both C5 and/or C6 nerve roots could be compressed. The canal is narrowed at both levels, more so at C5-6, where there is slight indentation of the ventral cord.   EMG dated 11/29/2012 performed at [place] and [employer], [name]:  Bilateral CTS, mild-moderate in degree.  Bilateral median sensory and motor latencies are prolonged, worse on the  right (sensory R 2.6 L 2.56m, motor: R 4.4652m L 4.52m66m with reduced sensory amplitudes (R 16.7, 19.0) and preserved motor amplitudes.  [facility] left [facility]: 1. Chronic L3-L4 radiculopathy affecting the left side, mild in degree electrically. 2. There is no evidence of a generalized sensorimotor polyneuropathy, carpal tunnel syndrome, or cervical radiculopathy affecting the left side.  MRI lumbar spine 02/10/2015: 1. Moderate size left extraforaminal disc extrusion at L4-5 with resulting left L4 nerve root encroachment. 2. 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 There is resulting mild multifactorial  spinal stenosis at L3-4 and L4-5. 3. Chronic degenerative disc disease at L5-S1 with endplate osteophytes and facet hypertrophy contributing to chronic osseous foraminal narrowing bilaterally.  MRI brain wo contrast 05/16/2016: 1. No acute intracranial abnormality. 2. Mild cerebral white matter T2 signal changes, nonspecific though most often seen with chronic small vessel ischemia.  CT lumbar spine wo contrast 05/27/2016:  1. L3-L5 fusion with improved spinal canal patency. Early osseous fusion across the L4-5 disc space. No evidence of hardware Complication.  2. Unchanged advanced disc degeneration at L5-S1 with moderate foraminal stenosis.  IMPRESSION.PLAN: Essential tremor, worsening.  He was previously on propranolol '40mg'$  twice daily, which he discontinued in the summer of 2020.  I have sent prescription for him to restart propranolol '40mg'$  BID.  This can be titrated going forward, if he does not find benefit on low dose.   Return to clinic in 1 year  Thank you for allowing me to participate in patient's care.  If I can answer any additional questions, I would be pleased to do so.    Sincerely,    Uriel Dowding K. Posey Pronto, DO

## 2019-12-05 ENCOUNTER — Encounter: Payer: Self-pay | Admitting: Neurology

## 2020-06-29 ENCOUNTER — Ambulatory Visit (INDEPENDENT_AMBULATORY_CARE_PROVIDER_SITE_OTHER): Payer: Medicare Other | Admitting: Neurology

## 2020-06-29 ENCOUNTER — Other Ambulatory Visit: Payer: Self-pay

## 2020-06-29 VITALS — BP 130/90 | HR 67 | Ht 71.0 in | Wt 226.4 lb

## 2020-06-29 DIAGNOSIS — R251 Tremor, unspecified: Secondary | ICD-10-CM

## 2020-06-29 NOTE — Patient Instructions (Signed)
We will order DaT scan.  We will call you with the results and let you know the next step.

## 2020-06-29 NOTE — Progress Notes (Signed)
Follow-up Visit   Date: 06/29/20    [name] MRN: [id number] DOB: [birth year]   Interim History: [name] is a 70 y.o. right-handed Caucasian male with history of GERD, hypertension, hyperlipidemia, bilateral CTS s/p release, s/p cervical C4-5, C5-6, C6-7 decompression and fusion (2016), s/p L3-4 and L4-5 lumbar decompression and fusion (2017), and chronic low back pain returning to the clinic for hand tremors.  He is a retired Therapist, art.   History of present illness: He had bilateral CTS release however continues to have numbness and problems with dexterity in the hands.  He has associated weakness of the hands and finds himself dropping things such as keys, using screwdrivers, wrenches, etc.   He has long history of low back pain and does endorse numbness of the feet, which predated his upper extremity symptoms. He has previously been seen by Dr. [name] who did EMG in late 1990s which showed nerve damage, left > right leg. He is taking gabapentin 820m BID for his feet, which helps.  He is followed by Dr. HMaryjean Kafor low back pain and has spinal cord stimulator.  In 2015, he had repeat NCS/EMG which showed no evidence of neuropathy or CTS on the left side, only mild L3-4 radiculopathy.  Neuropathy labs also returned normal.   In early 2017, he presented with bilateral hand and head tremor, which interferes with fine motor skills such as working with tools.  He did not have benefit with mirapex or primidone.  UPDATE 07/15/2019:  He is here for follow-up visit for tremors.  He was prescribed propranolol 457mtwice daily but stopped it in July when he had redo back surgery.  Since this time, he has noticed mild worsening of hand tremors, something interfering with his ability to write.    He continues to have is significant low back pain radiating into the legs and has history of lumbar surgery s/p spinal cord stimulator.  [facility]  UPDATE 06/29/2020:  He is here is for follow-up visit.  He tried taking propranolol, but each time he develops nauea, so discontinued it. He is concerning about parkinson's as a possibility.  He denies resting hand tremor, although there is a head tremor at rest.  His hand tremors are always worse when he is trying to do things, such as eating or holding objects.  No falls or stiffness in the arms.   He has ongoing left leg pain and is getting piriformis injection by his orthopeadic doctor.  Medications:  Current Outpatient Medications on File Prior to Visit  Medication Sig Dispense Refill  . acetaminophen (TYLENOL) 325 MG tablet Take 325 mg by mouth every 6 (six) hours as needed for mild pain.    . Marland Kitchenlbuterol (PROVENTIL HFA;VENTOLIN HFA) 108 (90 BASE) MCG/ACT inhaler Inhale 2 puffs into the lungs every 6 (six) hours as needed for wheezing or shortness of breath (cough).     . Marland Kitchentorvastatin (LIPITOR) 40 MG tablet     . celecoxib (CELEBREX) 200 MG capsule     . esomeprazole (NEXIUM) 20 MG capsule Take 40 mg by mouth daily.  0  . fluticasone (FLONASE) 50 MCG/ACT nasal spray Place 2 sprays into both nostrils daily as needed for allergies or rhinitis (congestion).     . gabapentin (NEURONTIN) 400 MG capsule Take 800 mg by mouth 2 (two) times daily.    . Marland Kitchenisinopril-hydrochlorothiazide (PRINZIDE,ZESTORETIC) 10-12.5 MG per tablet  Take 1 tablet by mouth daily.    Marland Kitchen loratadine (CLARITIN) 10 MG tablet Take 10 mg by mouth daily.     . methocarbamol (ROBAXIN) 750 MG tablet Take 1 tablet (750 mg total) by mouth every 6 (six) hours as needed for muscle spasms. 50 tablet 0  . Oxycodone HCl 10 MG TABS TAKE 1 TABLET BY MOUTH AS NEEDED 3 TIMES A DAY PER DAY FOR PAIN  0  . Probiotic Product (ALIGN) 4 MG CAPS Take 4 mg by mouth daily.     . propranolol (INDERAL) 40 MG tablet Take 1 tablet (40 mg total) by mouth 2 (two) times daily. 180 tablet 3  . ranitidine (ZANTAC) 150 MG tablet  Take 150 mg by mouth at bedtime.    . tamsulosin (FLOMAX) 0.4 MG CAPS capsule Take 0.4 mg by mouth daily with breakfast.  6  . traZODone (DESYREL) 150 MG tablet Take 150 mg by mouth at bedtime. For sleep    . VOLTAREN 1 % GEL APPLY TWO GRAMS FOR UPPER EXTREMITY FOUR TIMES DAILY  1  . zolpidem (AMBIEN) 10 MG tablet Take 10 mg by mouth at bedtime.    . levETIRAcetam (KEPPRA) 500 MG tablet  (Patient not taking: No sig reported)    . naloxegol oxalate (MOVANTIK) 25 MG TABS tablet Take by mouth.    Marland Kitchen QVAR REDIHALER 40 MCG/ACT inhaler  (Patient not taking: Reported on 06/29/2020)     No current facility-administered medications on file prior to visit.    Allergies: No Known Allergies   Vital Signs:  BP 130/90 (BP Location: Left Arm, Patient Position: Sitting, Cuff Size: Normal)   Pulse 67   Ht 5' 11" (1.803 m)   Wt 226 lb 6.4 oz (102.7 kg)   SpO2 95%   BMI 31.58 kg/m   Neurological Exam: MENTAL STATUS including orientation to time, place, person, recent and remote memory, attention span and concentration, language, and fund of knowledge is normal.  Speech is not dysarthric.  Affect is blunted.   CRANIAL NERVES:   Normal conjugate, extra-ocular eye movements in all directions of gaze.  No ptosis.  MOTOR:  Motor strength is 5/5 in all extremities.  Moderate high amlitude side-to-side head tremor at rest.  Mild bilateral hand tremor worse with finger-to-nose testing, no present at rest.  Tone is normal.    COORDINATION/GAIT:   Intact rapid alternating movements bilaterally.  Gait appears slow, unassisted and stable, antalgic due to low back pain.  Data: Labs 11/01/2013:  GTT 103/133/99, ESR 5, TSH 1.99, B12 318, vitamin B1 16, copper 103, ceruloplasmin 29, SPEP/UPEP with IFE no M protein  MRI cervical spine May 26, 2012:  Pronounced spondylosis at C4-5 and C5-6. There is foraminal stenosis of both levels bilaterally. Either or both C5 and/or C6 nerve roots could be compressed. The canal is  narrowed at both levels, more so at C5-6, where there is slight indentation of the ventral cord.   EMG dated 11/29/2012 performed at [facility], [place]:  Bilateral CTS, mild-moderate in degree.  Bilateral median sensory and motor latencies are prolonged, worse on the right (sensory R 2.6 L 2.69m, motor: R 4.464m L 4.70m54m with reduced sensory amplitudes (R 16.7, 19.0) and preserved motor amplitudes.  [facility]: 1. Chronic L3-L4 radiculopathy affecting the left side, mild in degree electrically. 2. There is no evidence of a generalized sensorimotor polyneuropathy, carpal tunnel syndrome, or cervical radiculopathy affecting the left side.  MRI lumbar spine 02/10/2015: 1. Moderate size left extraforaminal disc extrusion at L4-5 with resulting left L4 nerve root encroachment. 2. 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 There is resulting mild multifactorial spinal stenosis at L3-4 and L4-5. 3. Chronic degenerative disc disease at L5-S1 with endplate osteophytes and facet hypertrophy contributing to chronic osseous foraminal narrowing bilaterally.  MRI brain wo contrast 05/16/2016: 1. No acute intracranial abnormality. 2. Mild cerebral white matter T2 signal changes, nonspecific though most often seen with chronic small vessel ischemia.  CT lumbar spine wo contrast 05/27/2016:  1. L3-L5 fusion with improved spinal canal patency. Early osseous fusion across the L4-5 disc space. No evidence of hardware Complication.  2. Unchanged advanced disc degeneration at L5-S1 with moderate foraminal stenosis.  IMPRESSION.PLAN: Tremor involving the hands and head - worsening.  Mild parkinsonian features of blunted affect, resting head tremor, slowed gait. No rigidity or marked bradykinesia  - He has previously been on primidone and propranolol without any benefit  - I will check DaT scan to be sure this is not  atypical parkinsonian presentation  Further recommendations pending results.   Thank you for allowing me to participate in patient's care.  If I can answer any additional questions, I would be pleased to do so.    Sincerely,    Donika K. Patel, DO 

## 2020-07-05 ENCOUNTER — Telehealth: Payer: Self-pay | Admitting: Neurology

## 2020-07-05 NOTE — Telephone Encounter (Signed)
Patient called in stating he thought someone was supposed to call him about setting up a DaT scan, but he has not heard back from anyone.

## 2020-07-05 NOTE — Telephone Encounter (Signed)
Nothing yet. Per Darden Dates yesterday she hadn't gotten anything either

## 2020-07-11 ENCOUNTER — Other Ambulatory Visit (HOSPITAL_COMMUNITY): Payer: Self-pay | Admitting: Neurology

## 2020-07-11 DIAGNOSIS — R251 Tremor, unspecified: Secondary | ICD-10-CM

## 2020-07-19 ENCOUNTER — Encounter (HOSPITAL_COMMUNITY)
Admission: RE | Admit: 2020-07-19 | Discharge: 2020-07-19 | Disposition: A | Discharge status: Home / Self Care | Payer: Medicare Other | Source: Ambulatory Visit | Attending: Neurology | Admitting: Neurology

## 2020-07-19 ENCOUNTER — Other Ambulatory Visit: Payer: Self-pay

## 2020-07-19 DIAGNOSIS — R251 Tremor, unspecified: Secondary | ICD-10-CM | POA: Diagnosis not present

## 2020-07-19 MED ORDER — POTASSIUM IODIDE (ANTIDOTE) 130 MG PO TABS: 130.0000 mg | ORAL_TABLET | Freq: Once | ORAL | Status: AC

## 2020-07-19 MED ORDER — POTASSIUM IODIDE (ANTIDOTE) 130 MG PO TABS
ORAL_TABLET | ORAL | Status: AC
  Administered 2020-07-19: 130 mg via ORAL
  Filled 2020-07-19: qty 1

## 2020-07-19 MED ORDER — IOFLUPANE I 123 185 MBQ/2.5ML IV SOLN
4.4000 | Freq: Once | INTRAVENOUS | Status: AC | PRN
  Administered 2020-07-19: 4.4 via INTRAVENOUS
  Filled 2020-07-19: qty 5

## 2020-07-20 ENCOUNTER — Ambulatory Visit: Payer: Medicare Other | Admitting: Neurology

## 2020-07-23 ENCOUNTER — Telehealth: Payer: Self-pay

## 2020-07-23 NOTE — Telephone Encounter (Signed)
-----   Message from Alda Berthold, DO sent at 07/23/2020 11:17 AM EDT ----- Please inform [name] that his DAT scan was normal - no signs of parkinson's disease.  Recommend setting up a follow-up so we can discuss medication options. Thanks.

## 2020-07-23 NOTE — Telephone Encounter (Signed)
Called patient and informed him of results and recommendations. Patient stated that he will give [place] a call back to reschedule his f/u. Patient had no further questions or concerns.

## 2023-03-16 IMAGING — NM NM DATSCAN
2 series · 12 of 12 positions shown · non-contrast
Comparison: None.

CLINICAL DATA: 69-year-old male with LEFT leg tremors. Word-finding
difficulty. Unsteady gait.

EXAM:
NUCLEAR MEDICINE BRAIN IMAGING WITH SPECT  (DaTscan )
TECHNIQUE: SPECT images of the brain were obtained after intravenous injection
of radiopharmaceutical. 4 hour post injection imaging. Appropriate
positioning. 130 mg i-STAT given orally for thyroid blockade.
RADIOPHARMACEUTICALS:  4.4 millicuries I 123 Ioflupane

[Series 1: spect - 159 kev _(id)_cor · 4.1mm · 4.14mm/px · 6 of 128 frames shown]
[frame 11/128]
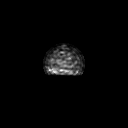
[frame 32/128]
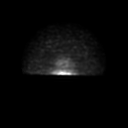
[frame 54/128]
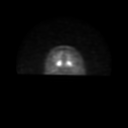
[frame 75/128]
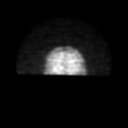
[frame 96/128]
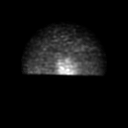
[frame 118/128]
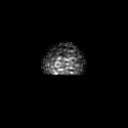

[Series 1: spect - 159 kev _(id)_tra · 4.1mm · 4.14mm/px · 6 of 128 frames shown]
[frame 11/128]
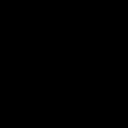
[frame 32/128]
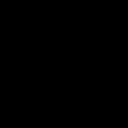
[frame 54/128]
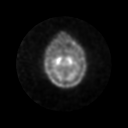
[frame 75/128]
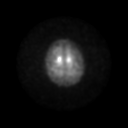
[frame 96/128]
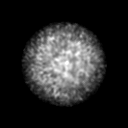
[frame 118/128]
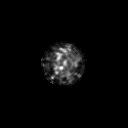

[12 of 12 positions shown; findings below may reference images not displayed]

FINDINGS: Symmetric intense uptake within LEFT and RIGHT striata. The heads of
the caudate nuclei and the posterior striata (putamen) are normal
shape. No evidence of loss of dopamine transport populations in the
basal ganglia.
IMPRESSION: Normal Ioflupane scan. No reduced radiotracer activity in basal
ganglia to suggest Parkinson's syndrome pathology.

Of note, DaTSCAN is not diagnostic of Parkinsonian syndromes, which
remains a clinical diagnosis. DaTscan is an adjuvant test to aid in
the clinical diagnosis of Parkinsonian syndromes.

## 2023-04-02 ENCOUNTER — Ambulatory Visit: Payer: Medicare Other | Admitting: Dermatology

## 2023-04-02 NOTE — Progress Notes (Deleted)
   New Patient Visit   Subjective  [name] is a 73 y.o. male who presents for the following: Consult for Mohs for a BCC.  The patient has spots, moles and lesions to be evaluated, some may be new or changing and the patient may have concern these could be cancer.   The following portions of the chart were reviewed this encounter and updated as appropriate: medications, allergies, medical history  Review of Systems:  No other skin or systemic complaints except as noted in HPI or Assessment and Plan.  Objective  Well appearing patient in no apparent distress; mood and affect are within normal limits.   A focused examination was performed of the following areas: ***  Relevant exam findings are noted in the Assessment and Plan.    Assessment & Plan       No follow-ups on file.  [name], Surg Tech III, am acting as scribe for RUFUS CHRISTELLA HOLY, MD.   Documentation: I have reviewed the above documentation for accuracy and completeness, and I agree with the above.  RUFUS CHRISTELLA HOLY, MD
